# Patient Record
Sex: Male | Born: 1975 | Race: White | Hispanic: No | Marital: Single
Health system: Southern US, Community
[De-identification: ages and names within clinical notes are randomized; demographics above are authoritative.]

## PROBLEM LIST (undated history)

## (undated) DIAGNOSIS — S069XAA Unspecified intracranial injury with loss of consciousness status unknown, initial encounter: Secondary | ICD-10-CM

## (undated) DIAGNOSIS — N390 Urinary tract infection, site not specified: Secondary | ICD-10-CM

## (undated) DIAGNOSIS — S069X9A Unspecified intracranial injury with loss of consciousness of unspecified duration, initial encounter: Secondary | ICD-10-CM

---

## 1997-10-30 ENCOUNTER — Other Ambulatory Visit: Admission: RE | Admit: 1997-10-30 | Discharge: 1997-10-30 | Payer: Self-pay | Admitting: Family Medicine

## 1998-02-05 ENCOUNTER — Encounter: Admission: RE | Admit: 1998-02-05 | Discharge: 1998-05-06 | Payer: Self-pay

## 1999-03-02 ENCOUNTER — Ambulatory Visit (HOSPITAL_COMMUNITY): Admission: RE | Admit: 1999-03-02 | Discharge: 1999-03-02 | Payer: Self-pay | Admitting: *Deleted

## 2002-01-05 ENCOUNTER — Encounter: Payer: Self-pay | Admitting: Emergency Medicine

## 2002-01-05 ENCOUNTER — Emergency Department (HOSPITAL_COMMUNITY): Admission: EM | Admit: 2002-01-05 | Discharge: 2002-01-05 | Payer: Self-pay | Admitting: Emergency Medicine

## 2002-04-07 ENCOUNTER — Emergency Department (HOSPITAL_COMMUNITY): Admission: EM | Admit: 2002-04-07 | Discharge: 2002-04-07 | Payer: Self-pay | Admitting: Emergency Medicine

## 2002-04-07 ENCOUNTER — Encounter: Payer: Self-pay | Admitting: Emergency Medicine

## 2003-07-19 ENCOUNTER — Emergency Department (HOSPITAL_COMMUNITY): Admission: EM | Admit: 2003-07-19 | Discharge: 2003-07-19 | Payer: Self-pay | Admitting: Emergency Medicine

## 2003-07-30 ENCOUNTER — Emergency Department (HOSPITAL_COMMUNITY): Admission: EM | Admit: 2003-07-30 | Discharge: 2003-07-30 | Payer: Self-pay | Admitting: Emergency Medicine

## 2003-09-28 ENCOUNTER — Emergency Department (HOSPITAL_COMMUNITY): Admission: EM | Admit: 2003-09-28 | Discharge: 2003-09-28 | Payer: Self-pay | Admitting: Emergency Medicine

## 2005-04-06 ENCOUNTER — Encounter: Admission: RE | Admit: 2005-04-06 | Discharge: 2005-05-04 | Payer: Self-pay | Admitting: Internal Medicine

## 2005-05-05 ENCOUNTER — Encounter: Admission: RE | Admit: 2005-05-05 | Discharge: 2005-06-29 | Payer: Self-pay | Admitting: Internal Medicine

## 2007-05-08 ENCOUNTER — Encounter: Admission: RE | Admit: 2007-05-08 | Discharge: 2007-06-26 | Payer: Self-pay | Admitting: Internal Medicine

## 2007-06-18 ENCOUNTER — Emergency Department (HOSPITAL_COMMUNITY): Admission: EM | Admit: 2007-06-18 | Discharge: 2007-06-18 | Payer: Self-pay | Admitting: Emergency Medicine

## 2007-07-01 ENCOUNTER — Emergency Department (HOSPITAL_COMMUNITY): Admission: EM | Admit: 2007-07-01 | Discharge: 2007-07-01 | Payer: Self-pay | Admitting: Emergency Medicine

## 2008-06-02 ENCOUNTER — Encounter: Admission: RE | Admit: 2008-06-02 | Discharge: 2008-07-23 | Payer: Self-pay | Admitting: Internal Medicine

## 2015-10-15 ENCOUNTER — Ambulatory Visit (HOSPITAL_COMMUNITY)
Admission: EM | Admit: 2015-10-15 | Discharge: 2015-10-15 | Disposition: A | Discharge status: Home / Self Care | Payer: Medicaid Other | Attending: Family Medicine | Admitting: Family Medicine

## 2015-10-15 ENCOUNTER — Encounter (HOSPITAL_COMMUNITY): Payer: Self-pay | Admitting: *Deleted

## 2015-10-15 ENCOUNTER — Emergency Department (HOSPITAL_COMMUNITY)
Admission: EM | Admit: 2015-10-15 | Discharge: 2015-10-17 | Disposition: A | Discharge status: Home / Self Care | Payer: Medicare Other | Attending: Emergency Medicine | Admitting: Emergency Medicine

## 2015-10-15 ENCOUNTER — Encounter (HOSPITAL_COMMUNITY): Payer: Self-pay | Admitting: Emergency Medicine

## 2015-10-15 DIAGNOSIS — Z791 Long term (current) use of non-steroidal anti-inflammatories (NSAID): Secondary | ICD-10-CM | POA: Diagnosis not present

## 2015-10-15 DIAGNOSIS — N390 Urinary tract infection, site not specified: Secondary | ICD-10-CM | POA: Diagnosis not present

## 2015-10-15 DIAGNOSIS — Z79899 Other long term (current) drug therapy: Secondary | ICD-10-CM | POA: Diagnosis not present

## 2015-10-15 DIAGNOSIS — S069XAA Unspecified intracranial injury with loss of consciousness status unknown, initial encounter: Secondary | ICD-10-CM | POA: Diagnosis present

## 2015-10-15 DIAGNOSIS — R45851 Suicidal ideations: Secondary | ICD-10-CM | POA: Insufficient documentation

## 2015-10-15 DIAGNOSIS — F4321 Adjustment disorder with depressed mood: Secondary | ICD-10-CM | POA: Diagnosis present

## 2015-10-15 DIAGNOSIS — F17200 Nicotine dependence, unspecified, uncomplicated: Secondary | ICD-10-CM | POA: Insufficient documentation

## 2015-10-15 DIAGNOSIS — S069X9A Unspecified intracranial injury with loss of consciousness of unspecified duration, initial encounter: Secondary | ICD-10-CM | POA: Diagnosis present

## 2015-10-15 HISTORY — DX: Urinary tract infection, site not specified: N39.0

## 2015-10-15 HISTORY — DX: Unspecified intracranial injury with loss of consciousness status unknown, initial encounter: S06.9XAA

## 2015-10-15 HISTORY — DX: Unspecified intracranial injury with loss of consciousness of unspecified duration, initial encounter: S06.9X9A

## 2015-10-15 LAB — POCT URINALYSIS DIP (DEVICE)
GLUCOSE, UA: NEGATIVE mg/dL
Ketones, ur: NEGATIVE mg/dL
NITRITE: POSITIVE — AB
PROTEIN: 100 mg/dL — AB
Specific Gravity, Urine: >=1.03 (ref 1.005–1.030)
UROBILINOGEN UA: 1 mg/dL (ref 0.0–1.0)
pH: 5.5 (ref 5.0–8.0)

## 2015-10-15 LAB — CBC
HCT: 45.6 % (ref 39.0–52.0)
Hemoglobin: 14.9 g/dL (ref 13.0–17.0)
MCH: 29.7 pg (ref 26.0–34.0)
MCHC: 32.7 g/dL (ref 30.0–36.0)
MCV: 90.8 fL (ref 78.0–100.0)
PLATELETS: 181 10*3/uL (ref 150–400)
RBC: 5.02 MIL/uL (ref 4.22–5.81)
RDW: 14 % (ref 11.5–15.5)
WBC: 6 10*3/uL (ref 4.0–10.5)

## 2015-10-15 LAB — URINE MICROSCOPIC-ADD ON

## 2015-10-15 LAB — URINALYSIS, ROUTINE W REFLEX MICROSCOPIC
GLUCOSE, UA: NEGATIVE mg/dL
Ketones, ur: NEGATIVE mg/dL
Nitrite: POSITIVE — AB
PH: 5.5 (ref 5.0–8.0)
Protein, ur: NEGATIVE mg/dL
SPECIFIC GRAVITY, URINE: 1.031 — AB (ref 1.005–1.030)

## 2015-10-15 LAB — COMPREHENSIVE METABOLIC PANEL
ALBUMIN: 4.1 g/dL (ref 3.5–5.0)
ALT: 20 U/L (ref 17–63)
ANION GAP: 6 (ref 5–15)
AST: 22 U/L (ref 15–41)
Alkaline Phosphatase: 75 U/L (ref 38–126)
BILIRUBIN TOTAL: 0.6 mg/dL (ref 0.3–1.2)
BUN: 20 mg/dL (ref 6–20)
CHLORIDE: 107 mmol/L (ref 101–111)
CO2: 28 mmol/L (ref 22–32)
Calcium: 9.1 mg/dL (ref 8.9–10.3)
Creatinine, Ser: 1.04 mg/dL (ref 0.61–1.24)
GFR calc Af Amer: >60 mL/min (ref >60)
GLUCOSE: 126 mg/dL — AB (ref 65–99)
POTASSIUM: 4.1 mmol/L (ref 3.5–5.1)
Sodium: 141 mmol/L (ref 135–145)
TOTAL PROTEIN: 7 g/dL (ref 6.5–8.1)

## 2015-10-15 LAB — RAPID URINE DRUG SCREEN, HOSP PERFORMED
AMPHETAMINES: NOT DETECTED
BENZODIAZEPINES: NOT DETECTED
Barbiturates: NOT DETECTED
COCAINE: NOT DETECTED
Opiates: NOT DETECTED
Tetrahydrocannabinol: NOT DETECTED

## 2015-10-15 LAB — ETHANOL

## 2015-10-15 LAB — SALICYLATE LEVEL: Salicylate Lvl: <4 mg/dL (ref 2.8–30.0)

## 2015-10-15 LAB — ACETAMINOPHEN LEVEL

## 2015-10-15 MED ORDER — ACETAMINOPHEN 325 MG PO TABS: 650.0000 mg | ORAL_TABLET | ORAL | Status: DC | PRN

## 2015-10-15 MED ORDER — CEPHALEXIN 500 MG PO CAPS: 500.0000 mg | ORAL_CAPSULE | Freq: Four times a day (QID) | ORAL | Status: DC

## 2015-10-15 MED ORDER — BACLOFEN 20 MG PO TABS
20.0000 mg | ORAL_TABLET | Freq: Two times a day (BID) | ORAL | Status: DC
  Administered 2015-10-16 (×3): 20 mg via ORAL
  Filled 2015-10-15 (×5): qty 1

## 2015-10-15 MED ORDER — LORAZEPAM 1 MG PO TABS
1.0000 mg | ORAL_TABLET | Freq: Three times a day (TID) | ORAL | Status: DC | PRN
  Filled 2015-10-15: qty 1

## 2015-10-15 MED ORDER — CEPHALEXIN 500 MG PO CAPS
500.0000 mg | ORAL_CAPSULE | Freq: Four times a day (QID) | ORAL | Status: DC
  Administered 2015-10-16 (×5): 500 mg via ORAL
  Filled 2015-10-15 (×5): qty 1

## 2015-10-15 MED ORDER — LINACLOTIDE 145 MCG PO CAPS
145.0000 ug | ORAL_CAPSULE | Freq: Every day | ORAL | Status: DC
  Administered 2015-10-16 – 2015-10-17 (×2): 145 ug via ORAL
  Filled 2015-10-15 (×3): qty 1

## 2015-10-15 MED ORDER — LEVETIRACETAM 500 MG PO TABS
500.0000 mg | ORAL_TABLET | Freq: Two times a day (BID) | ORAL | Status: DC
  Administered 2015-10-16 (×3): 500 mg via ORAL
  Filled 2015-10-15 (×5): qty 1

## 2015-10-15 MED ORDER — IBUPROFEN 200 MG PO TABS
600.0000 mg | ORAL_TABLET | Freq: Three times a day (TID) | ORAL | Status: DC | PRN
  Administered 2015-10-16: 600 mg via ORAL
  Filled 2015-10-15: qty 3

## 2015-10-15 MED ORDER — TRIMETHOPRIM 100 MG PO TABS
100.0000 mg | ORAL_TABLET | Freq: Two times a day (BID) | ORAL | Status: DC
  Administered 2015-10-16 (×3): 100 mg via ORAL
  Filled 2015-10-15 (×5): qty 1

## 2015-10-15 NOTE — ED Notes (Signed)
Lab was called and confirm they had the urine sample and would run the urinalysis.

## 2015-10-15 NOTE — Discharge Instructions (Signed)
Antibiotic Medicine °Antibiotic medicines are used to treat infections caused by bacteria. They work by injuring or killing the bacteria that is making you sick. °HOW IS AN ANTIBIOTIC CHOSEN? °An antibiotic is chosen based on many factors. To help your health care provider choose one for you, tell your health care provider if: °· You have any allergies. °· You are pregnant or plan to get pregnant. °· You are breastfeeding. °· You are taking any medicines. These include over-the-counter medicines, prescription medicines, and herbal remedies. °· You have a medical condition or problem you have not already discussed. °Your health care provider will also consider: °· How often the medicine has to be taken. °· Common side effects of the medicine. °· The cost of the medicine. °· The taste of the medicine. °If you have questions about why an antibiotic was chosen, make sure to ask. °FOR HOW LONG SHOULD I TAKE MY ANTIBIOTIC? °Continue to take your antibiotic for as long as told by your health care provider. Do not stop taking it when you feel better. If you stop taking it too soon: °· You may start to feel sick again. °· Your infection may become harder to treat. °· Complications may develop. °WHAT IF I MISS A DOSE? °Try not to miss any doses of medicine. If you miss a dose, take it as soon as possible. However, if it is almost time for the next dose: °· If you are taking 2 doses per day, take the missed dose and the next dose 5 to 6 hours apart. °· If you are taking 3 or more doses per day, take the missed dose and the next dose 2 to 4 hours apart, then go back to the normal schedule. °If you cannot make up a missed dose, take the next scheduled dose on time. Then take the missed dose after you have taken all the doses as recommended by your health care provider, as if you had one more dose left. °DO ANTIBIOTICS AFFECT BIRTH CONTROL? °Birth control pills may not work while you are on antibiotics. If you are taking birth  control pills, continue taking them as usual and use a second form of birth control, such as a condom, to avoid unwanted pregnancy. Continue using the second form of birth control until you are finished with your current 1 month cycle of birth control pills. °OTHER INFORMATION °· If there is any medicine left over, throw it away. °· Never take someone else's antibiotics. °· Never take leftover antibiotics. °SEEK MEDICAL CARE IF: °· You get worse. °· You do not feel better within a few days of starting the antibiotic medicine. °· You vomit. °· White patches appear in your mouth. °· You have new joint pain that begins after starting the antibiotic. °· You have new muscle aches that begin after starting the antibiotic. °· You had a fever before starting the antibiotic and it returns. °· You have any symptoms of an allergic reaction, such as an itchy rash. If this happens, stop taking the antibiotic. °SEEK IMMEDIATE MEDICAL CARE IF: °· Your urine turns dark or becomes blood-colored. °· Your skin turns yellow. °· You bruise or bleed easily. °· You have severe diarrhea and abdominal cramps. °· You have a severe headache. °· You have signs of a severe allergic reaction, such as: °· Trouble breathing. °· Wheezing. °· Swelling of the lips, tongue, or face. °· Fainting. °· Blisters on the skin or in the mouth. °If you have signs of a severe allergic   reaction, stop taking the antibiotic right away. °  °This information is not intended to replace advice given to you by your health care provider. Make sure you discuss any questions you have with your health care provider. °  °Document Released: 12/22/2003 Document Revised: 12/30/2014 Document Reviewed: 08/26/2014 °Elsevier Interactive Patient Education ©2016 Elsevier Inc. ° °Urinary Tract Infection °Urinary tract infections (UTIs) can develop anywhere along your urinary tract. Your urinary tract is your body's drainage system for removing wastes and extra water. Your urinary  tract includes two kidneys, two ureters, a bladder, and a urethra. Your kidneys are a pair of bean-shaped organs. Each kidney is about the size of your fist. They are located below your ribs, one on each side of your spine. °CAUSES °Infections are caused by microbes, which are microscopic organisms, including fungi, viruses, and bacteria. These organisms are so small that they can only be seen through a microscope. Bacteria are the microbes that most commonly cause UTIs. °SYMPTOMS  °Symptoms of UTIs may vary by age and gender of the patient and by the location of the infection. Symptoms in young women typically include a frequent and intense urge to urinate and a painful, burning feeling in the bladder or urethra during urination. Older women and men are more likely to be tired, shaky, and weak and have muscle aches and abdominal pain. A fever may mean the infection is in your kidneys. Other symptoms of a kidney infection include pain in your back or sides below the ribs, nausea, and vomiting. °DIAGNOSIS °To diagnose a UTI, your caregiver will ask you about your symptoms. Your caregiver will also ask you to provide a urine sample. The urine sample will be tested for bacteria and white blood cells. White blood cells are made by your body to help fight infection. °TREATMENT  °Typically, UTIs can be treated with medication. Because most UTIs are caused by a bacterial infection, they usually can be treated with the use of antibiotics. The choice of antibiotic and length of treatment depend on your symptoms and the type of bacteria causing your infection. °HOME CARE INSTRUCTIONS °· If you were prescribed antibiotics, take them exactly as your caregiver instructs you. Finish the medication even if you feel better after you have only taken some of the medication. °· Drink enough water and fluids to keep your urine clear or pale yellow. °· Avoid caffeine, tea, and carbonated beverages. They tend to irritate your  bladder. °· Empty your bladder often. Avoid holding urine for long periods of time. °· Empty your bladder before and after sexual intercourse. °· After a bowel movement, women should cleanse from front to back. Use each tissue only once. °SEEK MEDICAL CARE IF:  °· You have back pain. °· You develop a fever. °· Your symptoms do not begin to resolve within 3 days. °SEEK IMMEDIATE MEDICAL CARE IF:  °· You have severe back pain or lower abdominal pain. °· You develop chills. °· You have nausea or vomiting. °· You have continued burning or discomfort with urination. °MAKE SURE YOU:  °· Understand these instructions. °· Will watch your condition. °· Will get help right away if you are not doing well or get worse. °  °This information is not intended to replace advice given to you by your health care provider. Make sure you discuss any questions you have with your health care provider. °  °Document Released: 01/18/2005 Document Revised: 12/30/2014 Document Reviewed: 05/19/2011 °Elsevier Interactive Patient Education ©2016 Elsevier Inc. ° °Urine Culture   and Sensitivity Testing °WHY AM I HAVING THIS TEST?  °A urine culture is a test to see if germs grow from your urine sample. Normally, urine is free of germs (sterile). Germs in urine are usually bacteria. Sometimes they can be yeasts. These germs can cause a urinary tract infection (UTI). °You may have this test if you have symptoms of a UTI. These may include: °· Frequent urination. °· Burning pain when passing urine. °If you are pregnant, your health care provider may order this test to screen you for a UTI. °When you pass urine, the urine flows through the tube that empties your bladder (urethra). In men, urine comes out through an opening at the tip of the penis. In women, it comes out of the body from just above the vaginal opening. These areas may have bacteria near them that normally live on the skin (normal flora). °WHAT KIND OF SAMPLE IS TAKEN? °A urine sample for  a culture test must be collected in a way that keeps normal flora from getting into the sample. The method used most often is called a clean-catch sample. In a few cases, urine may need to be collected directly from the bladder using a thin, flexible tube (catheter). The health care provider puts the catheter through the person's urethra and into the bladder. °Your urine sample will be placed onto plates containing a substance that encourages bacteria to grow (agar plates). These plates are kept at body temperature for 24-48 hours to see if bacteria or other germs grow. Then, a lab technician examines them under a microscope to check for germs. °Any germs that grow from the culture will be tested against a variety of medicines to find the one that works best (sensitivity testing). For a UTI caused by bacteria, several types of antibiotic medicines may be tested. °HOW DO I PREPARE FOR THE TEST? °· Do not urinate for about an hour before collecting the sample. °· Drink a glass of water about 20 minutes before collecting the sample. °· Tell your health care provider if you have been taking antibiotics. This may affect the results of your test. °Your health care provider may give you sterile wipes to clean your vagina or penis to prepare for collecting a clean-catch sample. To collect the sample, you will need to do the following: °For Women and Girls °· Sit on the toilet and spread the lips of your vagina. °· Use one wipe to clean your vaginal area from front to back. °· Use a second wipe to clean the opening of your urethra. °· Pass a small amount of urine directly into the toilet while still spreading your vagina. °· Then, hold the sterile cup underneath you and urinate into it. °· Fill the cup about halfway. Cap it and return it for testing. °For Men and Boys °· Use the sterile wipe to clean the tip of your penis. °· Pass a small amount of urine directly into the toilet first. °· Then, urinate into the sterile  cup. °· Fill the cup about halfway. Cap it and return it for testing. °WHAT DO THE RESULTS MEAN? °The result of a urine culture and sensitivity test will be positive or negative.  °· If enough bacteria grow from your urine sample, your test result is considered positive. °· If many different bacteria grow from your urine sample, your test may be reported as contaminated. °· If no bacteria grow from your sample after 24-48 hours, your test result is considered negative. °· Results   of sensitivity testing let your health care provider know which medicines to use to treat your infection. °If the results of your urine culture are negative, this means: °· It is less likely that you have a UTI. °· Your test may be repeated if you still have symptoms. °If the results of your urine culture are positive, this means: °· It is more likely that you have a UTI. °· You may need to start treatment based on your sensitivity results. °Talk to your health care provider to discuss your results, treatment options, and if necessary, the need for more tests. °It is your responsibility to obtain your test results. Ask the lab or department performing the test when and how you will get your results. Talk with your health care provider if you have any questions about your results. °  °This information is not intended to replace advice given to you by your health care provider. Make sure you discuss any questions you have with your health care provider. °  °Document Released: 05/05/2004 Document Revised: 05/01/2014 Document Reviewed: 08/07/2013 °Elsevier Interactive Patient Education ©2016 Elsevier Inc. ° °

## 2015-10-15 NOTE — ED Provider Notes (Signed)
CSN: [id number]     Arrival date & time 10/15/15  1323 History   First MD Initiated Contact with Patient 10/15/15 1334     Chief Complaint  Patient presents with  . 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 Today his complaint is dysuria for up to one month. The caretaker that is present states he just started this today. He is unaware of any previous medical history in regards to symptoms, fever or other. There is no documentation with him regarding this condition. The current chart states that he is on trimethoprim twice a day. He is alert but his speech is dysarthric and very slow, almost unintelligible.  Patient is a 40 y.o. male presenting with urinary tract infection.  Urinary Tract Infection    Past Medical History  Diagnosis Date  . UTI (urinary tract infection)    History reviewed. No pertinent past surgical history. Family History  Problem Relation Age of Onset  . Family history unknown: Yes   Social History  Substance Use Topics  . Smoking status: Current Every Day Smoker  . Smokeless tobacco: None  . 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  HENT: Negative.   Respiratory: Negative.   Gastrointestinal: Negative.   Genitourinary: Positive for dysuria and frequency.  All other systems reviewed and are negative.   Allergies  Review of patient's allergies indicates no known allergies.  Home Medications   Prior to Admission medications   Medication Sig Start Date End Date Taking? Authorizing Provider  baclofen (LIORESAL) 10 MG tablet Take 10 mg by mouth 3 (three) times daily.   Yes Historical Provider, MD  levETIRAcetam (KEPPRA) 500 MG tablet Take 500 mg by  mouth 2 (two) times daily.   Yes Historical Provider, MD  linaclotide (LINZESS) 145 MCG CAPS capsule Take 145 mcg by mouth daily before breakfast.   Yes Historical Provider, MD  trimethoprim (TRIMPEX) 100 MG tablet Take 100 mg by mouth 2 (two) times daily.   Yes Historical Provider, MD  cephALEXin (KEFLEX) 500 MG capsule Take 1 capsule (500 mg total) by mouth 4 (four) times daily. 10/15/15   Hayden Rasmussenavid Treg Diemer, NP   Meds Ordered and Administered this Visit  Medications - No data to display  BP 125/87 mmHg  Pulse 90  Temp(Src) 99.2 F (37.3 C) (Oral)  Resp 18  SpO2 93% No data found.   Physical Exam  Constitutional: He appears well-nourished. No distress.  Eyes: EOM are normal.  Cardiovascular: Normal rate.   Pulmonary/Chest: Effort normal.  Musculoskeletal:  Torpid movements of the upper extremities  Neurological: He is alert. He exhibits normal muscle tone.  Skin: Skin is warm and dry.  Nursing note and vitals reviewed.   ED Course  Procedures (including critical care time)  Labs Review Labs Reviewed  POCT URINALYSIS DIP (DEVICE) - Abnormal; Notable for the following:    Bilirubin Urine SMALL (*)    Hgb urine dipstick TRACE (*)    Protein, ur 100 (*)    Nitrite POSITIVE (*)    Leukocytes, UA TRACE (*)    All other components within normal limits  URINE CULTURE    Imaging Review No results found.   Visual Acuity Review  Right Eye Distance:   Left Eye Distance:   Bilateral  Distance:    Right Eye Near:   Left Eye Near:    Bilateral Near:         MDM   1. UTI (lower urinary tract infection)    Meds ordered this encounter  Medications  . levETIRAcetam (KEPPRA) 500 MG tablet    Sig: Take 500 mg by mouth 2 (two) times daily.  Marland Kitchen. linaclotide (LINZESS) 145 MCG CAPS capsule    Sig: Take 145 mcg by mouth daily before breakfast.  . trimethoprim (TRIMPEX) 100 MG tablet    Sig: Take 100 mg by mouth 2 (two) times daily.  . baclofen (LIORESAL) 10 MG tablet    Sig:  Take 10 mg by mouth 3 (three) times daily.  . cephALEXin (KEFLEX) 500 MG capsule    Sig: Take 1 capsule (500 mg total) by mouth 4 (four) times daily.    Dispense:  40 capsule    Refill:  0    Order Specific Question:  Supervising Provider    Answer:  Bradd CanaryKINDL, JAMES D [[id number]]   Cephalexin was prescribed during this encounter. May continue taking the trimethoprim. Drink plenty of fluids and stay well-hydrated and follow-up with your [facility] doctor in a week or sooner if not getting better or having problems. Call for appointment Urine culture pending.    Hayden Rasmussenavid Jorell Agne, NP 10/15/15 [phone]
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
## 2015-10-15 NOTE — ED Provider Notes (Signed)
CSN: [id number]     Arrival date & time 10/15/15  2105 History   First MD Initiated Contact with Patient 10/15/15 2141     Chief Complaint  Patient presents with  . Medical Clearance      The history is provided by the EMS personnel, the patient and medical records.  Patient is under IVC and was brought in for leaving his house and reportedly being suicidal. Patient is very difficult to understand somewhat difficult to get history from. He states he was trying to leave. States he would not care for by car. Has had some dysuria. 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 Patient has reportedly in the care of the state. Reviewing records she was seen at urgent care earlier today and diagnosed UTI and given Keflex for it.  Past Medical History  Diagnosis Date  . UTI (urinary tract infection)   . 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 No pertinent past surgical history. Family History  Problem Relation Age of Onset  . Family history unknown: Yes   Social History  Substance Use Topics  . Smoking status: Current Every Day Smoker  . Smokeless tobacco: None  . 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  Psychiatric/Behavioral: Positive for suicidal ideas.      Allergies  Review of patient's allergies indicates no known allergies.  Home Medications   Prior to Admission medications   Medication Sig Start Date End Date Taking? Authorizing Provider  baclofen (LIORESAL) 10 MG tablet Take 20 mg by mouth 2 (two) times daily.    Yes Historical Provider, MD  levETIRAcetam (KEPPRA) 500 MG tablet Take 500 mg by mouth 2 (two) times daily.   Yes Historical Provider, MD  linaclotide (LINZESS) 145 MCG CAPS capsule Take 145 mcg by mouth daily before breakfast.   Yes Historical Provider, MD  trimethoprim (TRIMPEX) 100 MG tablet Take 100 mg by mouth 2 (two) times daily.   Yes Historical  Provider, MD  cephALEXin (KEFLEX) 500 MG capsule Take 1 capsule (500 mg total) by mouth 4 (four) times daily. 10/15/15   Hayden Rasmussenavid Mabe, NP   BP 126/82 mmHg  Pulse 102  Temp(Src) 98.4 F (36.9 C) (Oral)  Resp 18  SpO2 93% Physical Exam  Constitutional: He appears well-nourished.  Eyes: EOM are normal.  Neck: Neck supple.  Pulmonary/Chest: Effort normal.  Abdominal: Soft.  Neurological: He is alert.  Very difficult to understand but is apparently awake and appropriate. Confined to wheelchair.  Skin: Skin is warm.    ED Course  Procedures (including critical care time) Labs Review Labs Reviewed  COMPREHENSIVE METABOLIC PANEL - Abnormal; Notable for the following:    Glucose, Bld 126 (*)    All other components within normal limits  ACETAMINOPHEN LEVEL - Abnormal; Notable for the following:    Acetaminophen (Tylenol), Serum <10 (*)    All other components within normal limits  URINALYSIS, ROUTINE W REFLEX MICROSCOPIC (NOT AT [facility]) - Abnormal; Notable for the following:    Color, Urine AMBER (*)    APPearance CLOUDY (*)    Specific Gravity, Urine 1.031 (*)    Hgb urine dipstick TRACE (*)    Bilirubin Urine SMALL (*)    Nitrite POSITIVE (*)    Leukocytes, UA TRACE (*)    All other components within normal limits  URINE MICROSCOPIC-ADD ON - Abnormal; Notable for the following:  Squamous Epithelial / LPF 6-30 (*)    Bacteria, UA FEW (*)    All other components within normal limits  URINE CULTURE  ETHANOL  SALICYLATE LEVEL  CBC  URINE RAPID DRUG SCREEN, HOSP PERFORMED    Imaging Review No results found. I have personally reviewed and evaluated these images and lab results as part of my medical decision-making.   EKG Interpretation None      MDM   Final diagnoses:  Suicidal ideations  Acute lower UTI (urinary tract infection)    Patient with apparent urinary tract infection. Also suicidal. Appears medically cleared to be seen by TTS. Will start Keflex and urine  culture has been sent.    Benjiman CoreNathan Lisvet Rasheed, MD 10/15/15 [phone]

## 2015-10-15 NOTE — ED Notes (Signed)
Has a history of uti's per caregiver.  Complains of pain with urination

## 2015-10-16 ENCOUNTER — Encounter (HOSPITAL_COMMUNITY): Payer: Self-pay | Admitting: Registered Nurse

## 2015-10-16 DIAGNOSIS — S069XAA Unspecified intracranial injury with loss of consciousness status unknown, initial encounter: Secondary | ICD-10-CM | POA: Diagnosis present

## 2015-10-16 DIAGNOSIS — F4321 Adjustment disorder with depressed mood: Secondary | ICD-10-CM | POA: Diagnosis present

## 2015-10-16 DIAGNOSIS — S069X9A Unspecified intracranial injury with loss of consciousness of unspecified duration, initial encounter: Secondary | ICD-10-CM | POA: Diagnosis present

## 2015-10-16 DIAGNOSIS — R45851 Suicidal ideations: Secondary | ICD-10-CM | POA: Diagnosis not present

## 2015-10-16 LAB — URINE CULTURE

## 2015-10-16 NOTE — ED Notes (Signed)
He is cheerfully visiting with his sitter. He has no requests at this time.
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
## 2015-10-16 NOTE — Progress Notes (Signed)
CSW attempted to contact the patient's caregiver to alert him to the patient's discharge status and to see about transportation back to his residence.  Caregiver was not available and a voicemail message was left.  Seward SpeckLeo Alberto Pina [facility] FlintCSW,LCAS Behavioral Health Disposition CSW [phone]

## 2015-10-16 NOTE — ED Notes (Addendum)
Pt's caregiver employed by [employer], [phone] message left with oncall staff for Martina Sinnererrell Darden to return call to myself ASAP

## 2015-10-16 NOTE — ED Notes (Signed)
Pt. In burgundy scrubs. Pt.and belongings searched and wanded by security. Pt. 1 belongings bag.pt. Has 1 black pant, 1 pr. Black snickers, 1 pr. Black socks, 1 black/gray striped shirt.pt.belongings locked up at the nurses station on RES A and B side.

## 2015-10-16 NOTE — ED Notes (Signed)
Spoke with pts appointed caregiver Luis Morales, he states he spoke with a male today and advised him that he was told by [employer] that patient would be inpatient until at least Monday, Mr. Luis Morales states he is now out of town and will not return until tomorrow. Dr. Bishop Dublinarden also states Dr. Kyung Morales does not wish to return to his home, stating he will not force him to return if that is against his wishes. Mr. Luis Morales states he has been attempting to contact pts social worker Luis DusterMichelle Youkat(s/p), he states he has sent emails and called on multiple occasions with no response, he was unable to provide correct spelling of her name, phone # or email address. Mr. Luis Morales states if Mr. Luis Morales returns to his home and decides to leave, he can not stop him but will watch him and call [employer] again.

## 2015-10-16 NOTE — [facility] Suicide Risk Assessment (Signed)
Suicide Risk Assessment  Discharge Assessment   [facility]   Principal Problem: Adjustment disorder with depressed mood Discharge Diagnoses:  Patient Active Problem List   Diagnosis Date Noted  . Adjustment disorder with depressed mood [F43.21] 10/16/2015  . TBI (traumatic brain injury) (HCC) [S06.9X9A] 10/16/2015  . Suicidal ideations [R45.851]     Total Time spent with patient: 30 minutes  Musculoskeletal: Strength & Muscle Tone: decreased Gait & Station: Wheel chair for ambulation Patient leans: N/A   Psychiatric Specialty Exam:   Blood pressure 129/76, pulse 71, temperature 98.1 F (36.7 C), temperature source Oral, resp. rate 16, SpO2 98 %.There is no height or weight on file to calculate BMI.   General Appearance: Casual  Eye Contact: Good  Speech: Slurred  Volume: Decreased  Mood: Anxious  Affect: Congruent  Thought Process: Goal Directed  Orientation: Full (Time, Place, and Person)  Thought Content: Denies hallucinations, delusions, and paranoia  Suicidal Thoughts: No  Homicidal Thoughts: No  Memory: Immediate; Fair Recent; Fair Remote; Fair  Judgement: Fair  Insight: Lacking  Psychomotor Activity: Decreased  Concentration: Concentration: Fair and Attention Span: Fair  Recall: [employer]  Fund of Knowledge: Fair  Language: Fair  Akathisia: No  Handed: Right  AIMS (if indicated):    Assets: Desire for Improvement  ADL's: Intact  Cognition: WNL  Sleep:          Mental Status Per Nursing Assessment::   On Admission:     Demographic Factors:  Male and Caucasian  Loss Factors: Decline in physical health  Historical Factors: Prior suicide attempts  Risk Reduction Factors:   Positive therapeutic relationship  Continued Clinical Symptoms:  Medical Diagnoses and Treatments/Surgeries  Cognitive Features That Contribute To Risk:  Closed-mindedness    Suicide Risk:   Minimal: No identifiable suicidal ideation.  Patients presenting with no risk factors but with morbid ruminations; may be classified as minimal risk based on the severity of the depressive symptoms    Plan Of Care/Follow-up recommendations:  Activity:  As tolerated Diet:  As tolerated  Rankin, Shuvon, NP 10/16/2015, 4:08 PM   Patient seen and I agree with treatment plan  Diannia Rudereborah Ross M.D.

## 2015-10-16 NOTE — ED Notes (Signed)
Spoke with Erick BlinksSara, AC, referral call made to APS

## 2015-10-16 NOTE — ED Notes (Signed)
I have just attempted to phone his caregiver/guardian and left message on answering machine.

## 2015-10-16 NOTE — ED Notes (Signed)
Spoke with APS oncall-Wes, information given regarding situation, he will return call with further guidance.

## 2015-10-16 NOTE — ED Notes (Signed)
Pt awake, advised we were still attempting to contact caregiver, call bell within reach, bed adjusted per pt request.

## 2015-10-16 NOTE — Consult Note (Signed)
[facility] Face-to-Face Psychiatry Consult   Reason for Consult:  Wheeled wheel chair into road Referring Physician:  EDP Patient Identification: Luis Morales MRN:  [id number] Principal Diagnosis: Adjustment disorder with depressed mood Diagnosis:   Patient Active Problem List   Diagnosis Date Noted  . Adjustment disorder with depressed mood [F43.21] 10/16/2015  . TBI (traumatic brain injury) [facility]) [S06.9X9A] 10/16/2015    Total Time spent with patient: 30 minutes  Subjective:   Luis Morales is a 40 y.o. male patient [facility] under [facility] by care giver stating that patient had wheeled himself into the street trying to kill him self .  HPI:  Patient oriented, calm, and cooperative.  Speech is very slurred/garbled.  Patient was able to write down what we could not understand him saying.  Patient reports that his care giver "T/Travis" curses and yells at him and he doesn't like staying there.  State that Will took care of him last week and that Will works for [employer] and wants assistance finding some where else to live.  States that he does not want to live with T any longer.  Past Psychiatric History: Denies  Risk to Self: Suicidal Ideation: No Suicidal Intent: No Is patient at risk for suicide?: No Suicidal Plan?: No Access to Means: No What has been your use of drugs/alcohol within the last 12 months?: Denies How many times?: 0 Other Self Harm Risks: Denies Triggers for Past Attempts: None known Intentional Self Injurious Behavior: None Risk to Others: Homicidal Ideation: No Thoughts of Harm to Others: No Current Homicidal Intent: No Current Homicidal Plan: No Access to Homicidal Means: No History of harm to others?: No Assessment of Violence: None Noted Does patient have access to weapons?: No Criminal Charges Pending?: No Does patient have a court date: No Prior Inpatient Therapy: Prior Inpatient Therapy: Yes Prior Therapy Facilty/Provider(s): [facility] Prior Outpatient Therapy:  Prior Outpatient Therapy: No Prior Therapy Dates: UTA Prior Therapy Facilty/Provider(s): UTA Reason for Treatment: UTA Does patient have an ACCT team?: Unknown Does patient have Intensive In-House Services?  : Unknown Does patient have [facility] services? : Unknown Does patient have P4CC services?: Unknown  Past Medical History:  Past Medical History  Diagnosis Date  . UTI (urinary tract infection)   . Traumatic brain injury [facility])    History reviewed. No pertinent past surgical history. Family History:  Family History  Problem Relation Age of Onset  . Family history unknown: Yes   Family Psychiatric  History: Denies  Social History:  History  Alcohol Use No     History  Drug Use No    Social History   Social History  . Marital Status: Single    Spouse Name: N/A  . Number of Children: N/A  . Years of Education: N/A   Social History Main Topics  . Smoking status: Current Every Day Smoker  . Smokeless tobacco: None  . Alcohol Use: No  . Drug Use: No  . Sexual Activity: Not Asked   Other Topics Concern  . None   Social History Narrative   Additional Social History:    Allergies:  No Known Allergies  Labs:  Results for orders placed or performed during the hospital encounter of 10/15/15 (from the past 48 hour(s))  Comprehensive metabolic panel     Status: Abnormal   Collection Time: 10/15/15  9:48 PM  Result Value Ref Range   Sodium 141 135 - 145 mmol/L   Potassium 4.1 3.5 - 5.1 mmol/L   Chloride  107 101 - 111 mmol/L   CO2 28 22 - 32 mmol/L   Glucose, Bld 126 (H) 65 - 99 mg/dL   BUN 20 6 - 20 mg/dL   Creatinine, Ser 1.04 0.61 - 1.24 mg/dL   Calcium 9.1 8.9 - 10.3 mg/dL   Total Protein 7.0 6.5 - 8.1 g/dL   Albumin 4.1 3.5 - 5.0 g/dL   AST 22 15 - 41 U/L   ALT 20 17 - 63 U/L   Alkaline Phosphatase 75 38 - 126 U/L   Total Bilirubin 0.6 0.3 - 1.2 mg/dL   GFR calc non Af Amer >60 >60 mL/min   GFR calc Af Amer >60 >60 mL/min    Comment: (NOTE) The eGFR  has been calculated using the CKD EPI equation. This calculation has not been validated in all clinical situations. eGFR's persistently <60 mL/min signify possible Chronic Kidney Disease.    Anion gap 6 5 - 15  Ethanol     Status: None   Collection Time: 10/15/15  9:48 PM  Result Value Ref Range   Alcohol, Ethyl (B) <5 <5 mg/dL    Comment:        LOWEST DETECTABLE LIMIT FOR SERUM ALCOHOL IS 5 mg/dL FOR MEDICAL PURPOSES ONLY   Salicylate level     Status: None   Collection Time: 10/15/15  9:48 PM  Result Value Ref Range   Salicylate Lvl <2.8 2.8 - 30.0 mg/dL  Acetaminophen level     Status: Abnormal   Collection Time: 10/15/15  9:48 PM  Result Value Ref Range   Acetaminophen (Tylenol), Serum <10 (L) 10 - 30 ug/mL    Comment:        THERAPEUTIC CONCENTRATIONS VARY SIGNIFICANTLY. A RANGE OF 10-30 ug/mL MAY BE AN EFFECTIVE CONCENTRATION FOR MANY PATIENTS. HOWEVER, SOME ARE BEST TREATED AT CONCENTRATIONS OUTSIDE THIS RANGE. ACETAMINOPHEN CONCENTRATIONS >150 ug/mL AT 4 HOURS AFTER INGESTION AND >50 ug/mL AT 12 HOURS AFTER INGESTION ARE OFTEN ASSOCIATED WITH TOXIC REACTIONS.   cbc     Status: None   Collection Time: 10/15/15  9:48 PM  Result Value Ref Range   WBC 6.0 4.0 - 10.5 K/uL   RBC 5.02 4.22 - 5.81 MIL/uL   Hemoglobin 14.9 13.0 - 17.0 g/dL   HCT 45.6 39.0 - 52.0 %   MCV 90.8 78.0 - 100.0 fL   MCH 29.7 26.0 - 34.0 pg   MCHC 32.7 30.0 - 36.0 g/dL   RDW 14.0 11.5 - 15.5 %   Platelets 181 150 - 400 K/uL  Rapid urine drug screen (hospital performed)     Status: None   Collection Time: 10/15/15  9:54 PM  Result Value Ref Range   Opiates NONE DETECTED NONE DETECTED   Cocaine NONE DETECTED NONE DETECTED   Benzodiazepines NONE DETECTED NONE DETECTED   Amphetamines NONE DETECTED NONE DETECTED   Tetrahydrocannabinol NONE DETECTED NONE DETECTED   Barbiturates NONE DETECTED NONE DETECTED    Comment:        DRUG SCREEN FOR MEDICAL PURPOSES ONLY.  IF CONFIRMATION IS  NEEDED FOR ANY PURPOSE, NOTIFY LAB WITHIN 5 DAYS.        LOWEST DETECTABLE LIMITS FOR URINE DRUG SCREEN Drug Class       Cutoff (ng/mL) Amphetamine      1000 Barbiturate      200 Benzodiazepine   315 Tricyclics       176 Opiates          300 Cocaine  300 THC              50   Urinalysis, Routine w reflex microscopic     Status: Abnormal   Collection Time: 10/15/15  9:54 PM  Result Value Ref Range   Color, Urine AMBER (A) YELLOW    Comment: BIOCHEMICALS MAY BE AFFECTED BY COLOR   APPearance CLOUDY (A) CLEAR   Specific Gravity, Urine 1.031 (H) 1.005 - 1.030   pH 5.5 5.0 - 8.0   Glucose, UA NEGATIVE NEGATIVE mg/dL   Hgb urine dipstick TRACE (A) NEGATIVE   Bilirubin Urine SMALL (A) NEGATIVE   Ketones, ur NEGATIVE NEGATIVE mg/dL   Protein, ur NEGATIVE NEGATIVE mg/dL   Nitrite POSITIVE (A) NEGATIVE   Leukocytes, UA TRACE (A) NEGATIVE  Urine microscopic-add on     Status: Abnormal   Collection Time: 10/15/15  9:54 PM  Result Value Ref Range   Squamous Epithelial / LPF 6-30 (A) NONE SEEN   WBC, UA 6-30 0 - 5 WBC/hpf   RBC / HPF 0-5 0 - 5 RBC/hpf   Bacteria, UA FEW (A) NONE SEEN   Urine-Other MUCOUS PRESENT     Comment: AMORPHOUS URATES/PHOSPHATES    Current Facility-Administered Medications  Medication Dose Route Frequency Provider Last Rate Last Dose  . acetaminophen (TYLENOL) tablet 650 mg  650 mg Oral Q4H PRN Davonna Belling, MD      . baclofen (LIORESAL) tablet 20 mg  20 mg Oral BID Davonna Belling, MD   20 mg at 10/16/15 0929  . cephALEXin (KEFLEX) capsule 500 mg  500 mg Oral QID Davonna Belling, MD   500 mg at 10/16/15 1410  . ibuprofen (ADVIL,MOTRIN) tablet 600 mg  600 mg Oral Q8H PRN Davonna Belling, MD      . levETIRAcetam (KEPPRA) tablet 500 mg  500 mg Oral BID Davonna Belling, MD   500 mg at 10/16/15 0929  . linaclotide (LINZESS) capsule 145 mcg  145 mcg Oral QAC breakfast Davonna Belling, MD   145 mcg at 10/16/15 0732  . LORazepam (ATIVAN) tablet 1  mg  1 mg Oral Q8H PRN Davonna Belling, MD      . trimethoprim (TRIMPEX) tablet 100 mg  100 mg Oral BID Davonna Belling, MD   100 mg at 10/16/15 5784   Current Outpatient Prescriptions  Medication Sig Dispense Refill  . baclofen (LIORESAL) 10 MG tablet Take 20 mg by mouth 2 (two) times daily.     Marland Kitchen levETIRAcetam (KEPPRA) 500 MG tablet Take 500 mg by mouth 2 (two) times daily.    Marland Kitchen linaclotide (LINZESS) 145 MCG CAPS capsule Take 145 mcg by mouth daily before breakfast.    . trimethoprim (TRIMPEX) 100 MG tablet Take 100 mg by mouth 2 (two) times daily.    . cephALEXin (KEFLEX) 500 MG capsule Take 1 capsule (500 mg total) by mouth 4 (four) times daily. 40 capsule 0    Musculoskeletal: Strength & Muscle Tone: decreased Gait & Station: Wheel chair for ambulation Patient leans: N/A  Psychiatric Specialty Exam: Physical Exam  Constitutional: He is oriented to person, place, and time.  Respiratory: Effort normal.  Neurological: He is alert and oriented to person, place, and time.    Review of Systems  Musculoskeletal:       Wheel chair  Neurological:       TBI  Psychiatric/Behavioral: Negative for depression, suicidal ideas, hallucinations and substance abuse. The patient is not nervous/anxious and does not have insomnia.   All other systems reviewed and  are negative.   Blood pressure 129/76, pulse 71, temperature 98.1 F (36.7 C), temperature source Oral, resp. rate 16, SpO2 98 %.There is no height or weight on file to calculate BMI.  General Appearance: Casual  Eye Contact:  Good  Speech:  Slurred  Volume:  Decreased  Mood:  Anxious  Affect:  Congruent  Thought Process:  Goal Directed  Orientation:  Full (Time, Place, and Person)  Thought Content:  Denies hallucinations, delusions, and paranoia  Suicidal Thoughts:  No  Homicidal Thoughts:  No  Memory:  Immediate;   Fair Recent;   Fair Remote;   Fair  Judgement:  Fair  Insight:  Lacking  Psychomotor Activity:  Decreased   Concentration:  Concentration: Fair and Attention Span: Fair  Recall:  [employer] of Knowledge:  Fair  Language:  Fair  Akathisia:  No  Handed:  Right  AIMS (if indicated):     Assets:  Desire for Improvement  ADL's:  Intact  Cognition:  WNL  Sleep:        Treatment Plan Summary: Plan Discharge home with Care giver  Disposition: No evidence of imminent risk to self or others at present.   Patient does not meet criteria for psychiatric inpatient admission.     Earleen Newport, NP 10/16/2015 3:54 PM Patient seen and I agree with treatment and plan Levonne Spiller M.D.

## 2015-10-16 NOTE — ED Notes (Signed)
TTS at bedside. 

## 2015-10-16 NOTE — BH Assessment (Addendum)
Assessment Note  Luis Morales is an 40 y.o. male presenting to [facility] under [facility].   IVC states:  [name] has no listed mental diagnosis but [place]. [name] is in a wheelchair and rolled himself into  The street in front of traffic. [name] stated he doesn't care if he gets hit by a car he wants to die. [name] stated he's tired of living the way he does in the chair. [name] has stated he wants to go to the hospital or die. According to caregiver respondent is not taking his medications as prescribed. He told his caregiver to sleep with one eye open. [name] is cursing at everyone and making threats to others in the home. [name] is a danger to self or others.   Patient is difficult to understand in his speech. Patient denies SI and states "I want to live." Patient denies attempts and self  Injurious behaviors. Patient states that he does not like his caregiver and no longer wants to live with him. Patient reports that he wants to live with "Will." Patient denies HI and history of aggression. Patient denies AVH and does not appear to be responding to internal stimuli. Patient denies history of trauma/abuse and denies drug use. Patient UDS and BAL is clear at this time. Patient reports that he is an juncle to a niece and a nephew and that makes him happy and states "I want to live."   Consulted with Alberteen SamFran Hobson, NP who recommends observation overnight and evaluation in the morning by psychiatry for final disposition.   Diagnosis: Adjustment Disorder, with disturbance of conduct  Past Medical History:  Past Medical History  Diagnosis Date  . UTI (urinary tract infection)   . Traumatic brain injury [facility](HCC)     History reviewed. No pertinent past surgical history.  Family History:  Family History  Problem Relation Age of Onset  . Family history unknown: Yes    Social History:  reports that he has been smoking.  He does not have any  smokeless tobacco history on file. He reports that he does not drink alcohol or use illicit drugs.  Additional Social History:  Alcohol / Drug Use Pain Medications: See PTA Prescriptions: See PTA Over the Counter: See PTA History of alcohol / drug use?: No history of alcohol / drug abuse  CIWA: CIWA-Ar BP: 134/77 mmHg Pulse Rate: 98 COWS:    Allergies: No Known Allergies  Home Medications:  (Not in a hospital admission)  OB/GYN Status:  No LMP for male patient.  General Assessment Data [facility]: [facility] Assessment: In system Is this a Tele or Face-to-Face Assessment?: Face-to-Face Is this an Initial Assessment or a Re-assessment for this encounter?: Initial Assessment Marital status: Single Is patient pregnant?: No Pregnancy Status: No Living Arrangements: Non-relatives/Friends Can pt return to current living arrangement?: Yes Admission Status: Involuntary Is patient capable of signing voluntary admission?: No Referral Source: Self/Family/Friend     Crisis Care Plan Living Arrangements: Non-relatives/Friends  Education Status Is patient currently in school?: No  Risk to self with the past 6 months Suicidal Ideation: No Has patient been a risk to self within the past 6 months prior to admission? : No Suicidal Intent: No Has patient had any suicidal intent within the past 6 months prior to admission? : No Is patient at risk for suicide?: No Suicidal Plan?: No Has patient had any suicidal plan within the past 6 months prior to admission? : No Access to  Means: No What has been your use of drugs/alcohol within the last 12 months?: Denies Previous Attempts/Gestures: No How many times?: 0 Other Self Harm Risks: Denies Triggers for Past Attempts: None known Intentional Self Injurious Behavior: None Family Suicide History: No Persecutory voices/beliefs?: No Depression: Yes Depression Symptoms:  (unable to identify) Substance abuse history and/or  treatment for substance abuse?: No Suicide prevention information given to non-admitted patients: Not applicable  Risk to Others within the past 6 months Homicidal Ideation: No Does patient have any lifetime risk of violence toward others beyond the six months prior to admission? : No Thoughts of Harm to Others: No Current Homicidal Intent: No Current Homicidal Plan: No Access to Homicidal Means: No History of harm to others?: No Assessment of Violence: None Noted Does patient have access to weapons?: No Criminal Charges Pending?: No Does patient have a court date: No Is patient on probation?: No  Psychosis Hallucinations: None noted Delusions: None noted  Mental Status Report Appearance/Hygiene: In scrubs Eye Contact: Good Motor Activity: Unable to assess Speech: Slurred Level of Consciousness: Alert Mood: Pleasant Affect: Appropriate to circumstance Anxiety Level: None Thought Processes: Coherent, Relevant Judgement: Unimpaired Orientation: Person, Place, Time, Situation, Appropriate for developmental age Obsessive Compulsive Thoughts/Behaviors: None  Cognitive Functioning Concentration: Unable to Assess Memory: Recent Intact, Remote Intact IQ: Average Insight: Fair Impulse Control: Fair Appetite: Good Sleep: Unable to Assess Vegetative Symptoms: Unable to Assess  ADLScreening [facility] Assessment Services) Patient's cognitive ability adequate to safely complete daily activities?: No Patient able to express need for assistance with ADLs?: Yes Independently performs ADLs?: No  Prior Inpatient Therapy Prior Inpatient Therapy: Yes Prior Therapy Facilty/Provider(s): [facility]  Prior Outpatient Therapy Prior Outpatient Therapy: No Prior Therapy Dates: UTA Prior Therapy Facilty/Provider(s): UTA Reason for Treatment: UTA Does patient have an ACCT team?: Unknown Does patient have Intensive In-House Services?  : Unknown Does patient have [facility] services? : Unknown Does  patient have P4CC services?: Unknown  ADL Screening (condition at time of admission) Patient's cognitive ability adequate to safely complete daily activities?: No Is the patient deaf or have difficulty hearing?: No Does the patient have difficulty seeing, even when wearing glasses/contacts?: No Does the patient have difficulty concentrating, remembering, or making decisions?: No Patient able to express need for assistance with ADLs?: Yes Does the patient have difficulty dressing or bathing?: Yes Independently performs ADLs?: No Weakness of Legs: Both Weakness of Arms/Hands: Both  Home Assistive Devices/Equipment Home Assistive Devices/Equipment: Wheelchair, Shower chair with back    Abuse/Neglect Assessment (Assessment to be complete while patient is alone) Physical Abuse: Denies Verbal Abuse: Denies Sexual Abuse: Denies Exploitation of patient/patient's resources: Denies Self-Neglect: Denies Values / Beliefs Cultural Requests During Hospitalization: None Spiritual Requests During Hospitalization: None Consults Spiritual Care Consult Needed: No Social Work Consult Needed: No Merchant navy officerAdvance Directives (For Healthcare) Does patient have an advance directive?: No Would patient like information on creating an advanced directive?: No - patient declined information    Additional Information 1:1 In Past 12 Months?: No CIRT Risk: No Elopement Risk: No Does patient have medical clearance?: Yes     Disposition:  Disposition Initial Assessment Completed for this Encounter: Yes Disposition of Patient: Other dispositions (observe overnight per Alberteen SamFran Hobson, NP) Other disposition(s): Other (Comment)  On Site Evaluation by:   Reviewed with Physician:    Florice Hindle 10/16/2015 2:40 AM

## 2015-10-16 NOTE — BH Assessment (Signed)
Assessment completed. Consulted with Alberteen SamFran Hobson, NP who recommends overnight observation and AM psych eval.   Davina PokeJoVea Lilith Solana, LCSW Therapeutic Triage Specialist [facility] 10/16/2015 2:09 AM

## 2015-10-17 DIAGNOSIS — R45851 Suicidal ideations: Secondary | ICD-10-CM | POA: Diagnosis not present

## 2015-10-17 LAB — URINE CULTURE

## 2015-10-17 NOTE — ED Notes (Addendum)
APS returned call, social work will be notified of situation, any further assistance after hours call [phone]

## 2015-10-17 NOTE — ED Notes (Signed)
Pt resting on stretcher, NAD, sitter at bedside

## 2015-10-17 NOTE — ED Notes (Signed)
Patient's guardian arrived at ED to pick up patient. Discharge instructions discussed with patient and guardian. Patient and guardian verbalized understanding.

## 2015-10-17 NOTE — ED Notes (Signed)
Pt resting on R side with eyes closed, RR even and unlabored, NAD, will continue to monitor. Sitter at bedside

## 2015-10-18 ENCOUNTER — Telehealth (HOSPITAL_COMMUNITY): Payer: Self-pay | Admitting: Emergency Medicine

## 2015-10-18 NOTE — ED Notes (Signed)
Called pt and spoke w/caretaker (Mr. Bishop DublinDarden) ... notified of recent lab results from visit 6/23 Reports pt is feeling better and sx have subsided and is taking antibiotics.  Adv to if sx are not getting better to return  Mr. Bishop DublinDarden verb understanding  Per Dr. Dayton ScrapeMurray,  Notes Recorded by Charm RingsErin J Honig, MD on 10/17/2015 at 6:44 PM Urine culture growing multiple species. Given his UA at his [facility] on 6/23, recommend that he complete the course of Keflex prescribed. Follow up if symptoms are persistent.

## 2015-11-04 ENCOUNTER — Emergency Department (EMERGENCY_DEPARTMENT_HOSPITAL)
Admission: EM | Admit: 2015-11-04 | Discharge: 2015-11-05 | Disposition: A | Discharge status: Home / Self Care | Payer: Medicare Other | Source: Home / Self Care | Attending: Emergency Medicine | Admitting: Emergency Medicine

## 2015-11-04 ENCOUNTER — Encounter (HOSPITAL_COMMUNITY): Payer: Self-pay | Admitting: Emergency Medicine

## 2015-11-04 DIAGNOSIS — F329 Major depressive disorder, single episode, unspecified: Secondary | ICD-10-CM | POA: Diagnosis present

## 2015-11-04 DIAGNOSIS — Z5181 Encounter for therapeutic drug level monitoring: Secondary | ICD-10-CM

## 2015-11-04 DIAGNOSIS — F4321 Adjustment disorder with depressed mood: Secondary | ICD-10-CM | POA: Diagnosis not present

## 2015-11-04 DIAGNOSIS — F918 Other conduct disorders: Secondary | ICD-10-CM

## 2015-11-04 DIAGNOSIS — F17200 Nicotine dependence, unspecified, uncomplicated: Secondary | ICD-10-CM

## 2015-11-04 LAB — CBC WITH DIFFERENTIAL/PLATELET
BASOS ABS: 0 10*3/uL (ref 0.0–0.1)
Basophils Relative: 0 %
Eosinophils Absolute: 0.1 10*3/uL (ref 0.0–0.7)
Eosinophils Relative: 1 %
HEMATOCRIT: 43.5 % (ref 39.0–52.0)
Hemoglobin: 13.9 g/dL (ref 13.0–17.0)
LYMPHS ABS: 1.3 10*3/uL (ref 0.7–4.0)
LYMPHS PCT: 29 %
MCH: 29.3 pg (ref 26.0–34.0)
MCHC: 32 g/dL (ref 30.0–36.0)
MCV: 91.6 fL (ref 78.0–100.0)
MONO ABS: 0.3 10*3/uL (ref 0.1–1.0)
Monocytes Relative: 7 %
NEUTROS ABS: 2.8 10*3/uL (ref 1.7–7.7)
Neutrophils Relative %: 63 %
Platelets: 173 10*3/uL (ref 150–400)
RBC: 4.75 MIL/uL (ref 4.22–5.81)
RDW: 13.5 % (ref 11.5–15.5)
WBC: 4.5 10*3/uL (ref 4.0–10.5)

## 2015-11-04 LAB — RAPID URINE DRUG SCREEN, HOSP PERFORMED
AMPHETAMINES: NOT DETECTED
BARBITURATES: NOT DETECTED
BENZODIAZEPINES: NOT DETECTED
Cocaine: NOT DETECTED
Opiates: NOT DETECTED
TETRAHYDROCANNABINOL: NOT DETECTED

## 2015-11-04 LAB — ETHANOL

## 2015-11-04 LAB — COMPREHENSIVE METABOLIC PANEL
ALT: 28 U/L (ref 17–63)
AST: 30 U/L (ref 15–41)
Albumin: 3.8 g/dL (ref 3.5–5.0)
Alkaline Phosphatase: 68 U/L (ref 38–126)
Anion gap: 4 — ABNORMAL LOW (ref 5–15)
BUN: 14 mg/dL (ref 6–20)
CHLORIDE: 105 mmol/L (ref 101–111)
CO2: 31 mmol/L (ref 22–32)
Calcium: 8.8 mg/dL — ABNORMAL LOW (ref 8.9–10.3)
Creatinine, Ser: 0.94 mg/dL (ref 0.61–1.24)
GFR calc Af Amer: >60 mL/min (ref >60)
Glucose, Bld: 99 mg/dL (ref 65–99)
POTASSIUM: 3.8 mmol/L (ref 3.5–5.1)
SODIUM: 140 mmol/L (ref 135–145)
Total Bilirubin: 0.4 mg/dL (ref 0.3–1.2)
Total Protein: 6.6 g/dL (ref 6.5–8.1)

## 2015-11-04 LAB — ACETAMINOPHEN LEVEL: Acetaminophen (Tylenol), Serum: <10 ug/mL — ABNORMAL LOW (ref 10–30)

## 2015-11-04 LAB — CBG MONITORING, ED: Glucose-Capillary: 123 mg/dL — ABNORMAL HIGH (ref 65–99)

## 2015-11-04 LAB — SALICYLATE LEVEL

## 2015-11-04 MED ORDER — LEVETIRACETAM 500 MG PO TABS
500.0000 mg | ORAL_TABLET | Freq: Two times a day (BID) | ORAL | Status: DC
  Administered 2015-11-04 – 2015-11-05 (×3): 500 mg via ORAL
  Filled 2015-11-04 (×3): qty 1

## 2015-11-04 MED ORDER — LINACLOTIDE 145 MCG PO CAPS
145.0000 ug | ORAL_CAPSULE | Freq: Every day | ORAL | Status: DC
  Administered 2015-11-05: 145 ug via ORAL
  Filled 2015-11-04 (×2): qty 1

## 2015-11-04 MED ORDER — TRAZODONE HCL 100 MG PO TABS
100.0000 mg | ORAL_TABLET | Freq: Every day | ORAL | Status: DC
  Administered 2015-11-04: 100 mg via ORAL
  Filled 2015-11-04: qty 1

## 2015-11-04 MED ORDER — CITALOPRAM HYDROBROMIDE 10 MG PO TABS
10.0000 mg | ORAL_TABLET | Freq: Every day | ORAL | Status: DC
  Administered 2015-11-04 – 2015-11-05 (×2): 10 mg via ORAL
  Filled 2015-11-04 (×2): qty 1

## 2015-11-04 MED ORDER — BACLOFEN 20 MG PO TABS
20.0000 mg | ORAL_TABLET | Freq: Two times a day (BID) | ORAL | Status: DC
  Administered 2015-11-04 – 2015-11-05 (×3): 20 mg via ORAL
  Filled 2015-11-04 (×3): qty 1

## 2015-11-04 NOTE — ED Notes (Signed)
Bed: [facility] Expected date:  Expected time:  Means of arrival:  Comments: EMS- 40yo M, bizarre behavior

## 2015-11-04 NOTE — ED Notes (Addendum)
Pt was brought from the main ER and placed in room 32. Pt has contracted hands and a slight speech impediment. [facility]ently the [facility], Jessie Footoyka, is speaking to the pt. He remains in his wheelchair and is cooperative. Pt remains with a sitter. Pt has some garbled speech and does use sign language to communicate. Phoned pharmacy to send pts medications. (2:30pm) Pts caregiver  , Bishop DublinDarden, phoned and stated someone left him a message. His contact number is : [phone].Toyka made aware. (2:50pm)4:10pm Pts caregiver, Bishop DublinDarden, phoned to inquire about pts disposition. Per Darden , the pt was requesting cigarettes everyday and when the pt. was not able to smoke he would become very angry and threaten to hurt himself. 7pm Pt c/o right calf pain. Calf is reddened and warm to the touch. EDP made aware. Unable to assess if pt has sustained any injury recently or in the past due to pts garbled speech. EDP will evaluate. Report to oncoming shift.

## 2015-11-04 NOTE — BH Assessment (Addendum)
Assessment Note  Luis Morales is an 40 y.o. male with history of a TBI. He presents to [facility] with IVC papers. Patient has no listed mental diagnosis but has been previously committed in [place]. Patient is in a wheelchair and rolled himself intothe street where it was potential traffic. Patient stated he doesn't care if he gets hit by a car he wants to die.  Patient stated he's tired of living the way he does in the chair. Patient stated he wants to go to the hospital or die. Patient has also reportedly threatened to break a window in home and asked for the police officers gun today. Patient admits that he done this before, rolled wheel chair in the street with hopes of getting hit (SEE [facility] visit 10/15/2015).   Patient is difficult to understand in his speech. He appears to know sign language. Nursing staff made aware that patient would probably benefit from a interpreter. Patient admits to SI and states "I don't want to live."Onset of symptoms started 1 month ago. Patient denies history of self injurious behaviors.  Patient denies HI and history of aggression. Patient denies AVH and does not appear to be responding to internal stimuli. Patient denies history of trauma/abuse and denies drug use. Patient UDS and BAL is clear at this time. He reports a history of 1 INPT hospitalization; patient unsure of date and facilities name.     Diagnosis: Major Depressive Disorder, Recurrent Severe, without psychotic features  History reviewed. No pertinent past surgical history.  Family History:  Family History  Problem Relation Age of Onset  . Family history unknown: Yes    Social History:  reports that he has been smoking.  He does not have any smokeless tobacco history on file. He reports that he does not drink alcohol or use illicit drugs.  Additional Social History:  Alcohol / Drug Use Pain Medications: See PTA Prescriptions: See PTA Over the Counter: See PTA History of alcohol / drug use?:  No history of alcohol / drug abuse  CIWA: CIWA-Ar BP: 125/85 mmHg Pulse Rate: 71 COWS:    Allergies: No Known Allergies  Home Medications:  (Not in a hospital admission)  OB/GYN Status:  No LMP for male patient.  General Assessment Data [facility] of Assessment: [facility] TTS Assessment: In system Is this a Tele or Face-to-Face Assessment?: Face-to-Face Is this an Initial Assessment or a Re-assessment for this encounter?: Initial Assessment Marital status: Single Is patient pregnant?: No Pregnancy Status: No Living Arrangements: Non-relatives/Friends Can pt return to current living arrangement?: Yes Admission Status: Voluntary Is patient capable of signing voluntary admission?: No Referral Source: Self/Family/Friend     Crisis Care Plan Living Arrangements: Non-relatives/Friends Legal Guardian: Other: (no legal guardian ) Name of Psychiatrist:  (no psychiatrist reported) Name of Therapist:  (no therapist reported)  Education Status Is patient currently in school?: No Current Grade:  (n/a) Highest grade of school patient has completed:  (unk) Name of school:  (n/a) Contact person:  (n/a)  Risk to self with the past 6 months Suicidal Ideation: No Has patient been a risk to self within the past 6 months prior to admission? : No Suicidal Intent: No Has patient had any suicidal intent within the past 6 months prior to admission? : No Is patient at risk for suicide?: No Suicidal Plan?: No Has patient had any suicidal plan within the past 6 months prior to admission? : No Access to Means: No What has been your use of drugs/alcohol within  the last 12 months?:  (denies ) Previous Attempts/Gestures: No How many times?:  (0) Other Self Harm Risks:  (denies ) Triggers for Past Attempts: None known Intentional Self Injurious Behavior: None Family Suicide History: No Recent stressful life event(s): Other (Comment) (n/a) Persecutory voices/beliefs?: No Depression:  Yes Depression Symptoms:  (unable to identify ) Substance abuse history and/or treatment for substance abuse?: No Suicide prevention information given to non-admitted patients: Not applicable  Risk to Others within the past 6 months Homicidal Ideation: No Does patient have any lifetime risk of violence toward others beyond the six months prior to admission? : No Thoughts of Harm to Others: No Current Homicidal Intent: No Current Homicidal Plan: No Access to Homicidal Means: No History of harm to others?: No Assessment of Violence: None Noted Does patient have access to weapons?: Yes (Comment) Criminal Charges Pending?: No Does patient have a court date: No Is patient on probation?: No  Psychosis Hallucinations: None noted Delusions: None noted  Mental Status Report Appearance/Hygiene: In scrubs Eye Contact: Good Motor Activity: Unable to assess Speech: Slurred Level of Consciousness: Alert Mood: Other (Comment) (Pleasant ) Affect: Appropriate to circumstance Anxiety Level: None Thought Processes: Coherent, Relevant Judgement: Unimpaired Orientation: Person, Place, Time, Situation, Appropriate for developmental age Obsessive Compulsive Thoughts/Behaviors: None  Cognitive Functioning Concentration: Unable to Assess Memory: Recent Intact, Remote Intact IQ: Average Insight: Fair Impulse Control: Fair Appetite: Good Weight Loss:  (n/a) Weight Gain:  (n/a) Sleep: Unable to Assess Total Hours of Sleep:  (n/a) Vegetative Symptoms: Unable to Assess  ADLScreening [facility] Assessment Services) Patient's cognitive ability adequate to safely complete daily activities?: No Patient able to express need for assistance with ADLs?: Yes Independently performs ADLs?: No  Prior Inpatient Therapy Prior Inpatient Therapy: Yes Prior Therapy Facilty/Provider(s): [facility]  Prior Outpatient Therapy Prior Outpatient Therapy: No Prior Therapy Dates: UTA Prior Therapy Facilty/Provider(s):  [facility] Reason for Treatment: UTA Does patient have an ACCT team?: Unknown Does patient have Intensive In-House Services?  : Unknown Does patient have [facility] services? : Unknown Does patient have P4CC services?: Unknown  ADL Screening (condition at time of admission) Patient's cognitive ability adequate to safely complete daily activities?: No Is the patient deaf or have difficulty hearing?: Yes Does the patient have difficulty seeing, even when wearing glasses/contacts?: No Does the patient have difficulty concentrating, remembering, or making decisions?: Yes Patient able to express need for assistance with ADLs?: Yes Does the patient have difficulty dressing or bathing?: Yes Independently performs ADLs?: No Does the patient have difficulty walking or climbing stairs?: No Weakness of Legs: None Weakness of Arms/Hands: None  Home Assistive Devices/Equipment Home Assistive Devices/Equipment: None    Abuse/Neglect Assessment (Assessment to be complete while patient is alone) Physical Abuse: Denies Verbal Abuse: Denies Sexual Abuse: Denies Exploitation of patient/patient's resources: Denies Self-Neglect: Denies Values / Beliefs Cultural Requests During Hospitalization: None Spiritual Requests During Hospitalization: None   Advance Directives (For Healthcare) Does patient have an advance directive?: No Would patient like information on creating an advanced directive?: No - patient declined information Nutrition Screen- [facility] Patient's home diet: Regular  Additional Information 1:1 In Past 12 Months?: No CIRT Risk: No Elopement Risk: No Does patient have medical clearance?: Yes     Disposition:  Disposition Initial Assessment Completed for this Encounter: Yes Disposition of Patient:  (observe overnight per Nanine MeansJamison Lord, DNP; re-evaluate in the ) Other disposition(s): Other (Comment) (Pending am re-evaluation )    Melynda Rippleerry, Dontae Minerva [facility] 11/04/2015 12:27 PM

## 2015-11-04 NOTE — ED Notes (Addendum)
Per EMS and [employer]. Pt has been acting out to get out of his living situation. Hx of TBI. Pt currently living with a temporary caregiver Aurelio Jew(Terell Darden [phone]). Pt was going down the street in his wheelchair with no regard to traffic upon [employer] arrival. Pt threatened to break a window and was asking for [employer]'s gun. Pt used to be in a Group home. Denied SI/HI to EMS.

## 2015-11-04 NOTE — ED Provider Notes (Signed)
21:40-  I was asked by nursing to evaluate the patient for red and swollen right lower leg. Patient complains of pain in both lower legs. He denies trauma to the right leg. He denies shortness of breath, weakness or dizziness. On exam, there is a small scab on the right knee. There is vague erythema of the right anterior shin region. This erythema is in region of prior scarring indicative of remote trauma. There is no calf tenderness or swelling. The right leg, it's marginally larger than the left. He is able to exhibit normal range of motion of the right lower leg, with some effort. Left leg, is nontender to palpation and is without deformity, erythema or swelling.  Medical decision making- nonspecific erythema to the right lower leg, with evidence for recent trauma (abrasion), indicating likely erythema from mild trauma. I have very low suspicion for DVT. Patient is being observed for possible psychiatric placement. We'll order DVT study for the morning with Doppler imaging. In the meantime, I asked the nurse to apply heat to the affected area to see if we can improve the erythema.  Mancel BaleElliott Evann Erazo, MD 11/04/15 2155

## 2015-11-04 NOTE — ED Provider Notes (Signed)
CSN: [id number]     Arrival date & time 11/04/15  0957 History   First MD Initiated Contact with Patient 11/04/15 1115     Chief Complaint  Patient presents with  . Aggressive Behavior   HPI   Luis Morales is a 40 y.o. male PMH significant for TBI presenting with a 2 week history of SI. When asked if there is a trigger for suicidal ideation, patient states, "I've been in a wheelchair for 27 years." Per triage note, patient was going down the street in his wheelchair with no regard to traffic upon [employer] arrival. Patient admits to threatening to break a window and was asking for [employer]'s gun. He denies HI, hallucinations.  He states he is living in an assisted living facility.  Past Medical History  Diagnosis Date  . UTI (urinary tract infection)   . Traumatic brain injury [facility])    History reviewed. No pertinent past surgical history. Family History  Problem Relation Age of Onset  . Family history unknown: Yes   Social History  Substance Use Topics  . Smoking status: Current Every Day Smoker  . Smokeless tobacco: None  . Alcohol Use: No    Review of Systems  Ten systems are reviewed and are negative for acute change except as noted in the HPI  Allergies  Review of patient's allergies indicates no known allergies.  Home Medications   Prior to Admission medications   Medication Sig Start Date End Date Taking? Authorizing Provider  baclofen (LIORESAL) 10 MG tablet Take 20 mg by mouth 2 (two) times daily.     Historical Provider, MD  cephALEXin (KEFLEX) 500 MG capsule Take 1 capsule (500 mg total) by mouth 4 (four) times daily. 10/15/15   Hayden Rasmussen, NP  levETIRAcetam (KEPPRA) 500 MG tablet Take 500 mg by mouth 2 (two) times daily.    Historical Provider, MD  linaclotide (LINZESS) 145 MCG CAPS capsule Take 145 mcg by mouth daily before breakfast.    Historical Provider, MD  trimethoprim (TRIMPEX) 100 MG tablet Take 100 mg by mouth 2 (two) times daily.    Historical Provider, MD    BP 123/71 mmHg  Pulse 65  Temp(Src) 98.3 F (36.8 C) (Oral)  Resp 16  SpO2 95% Physical Exam  Constitutional: He appears well-developed and well-nourished.  HENT:  Head: Normocephalic.  Eyes: Conjunctivae and EOM are normal. Right eye exhibits no discharge. Left eye exhibits no discharge.  Cardiovascular: Normal rate.   Pulmonary/Chest: Effort normal.  Abdominal: Soft. He exhibits no distension.  Neurological: He is alert.  Slow speech that is somewhat difficult to interpret (h/o TBI; appears baseline per record review).   Skin: Skin is warm and dry.  Psychiatric: He has a normal mood and affect. His behavior is normal.    ED Course  Procedures  Labs Review Labs Reviewed  COMPREHENSIVE METABOLIC PANEL - Abnormal; Notable for the following:    Calcium 8.8 (*)    Anion gap 4 (*)    All other components within normal limits  ACETAMINOPHEN LEVEL - Abnormal; Notable for the following:    Acetaminophen (Tylenol), Serum <10 (*)    All other components within normal limits  CBG MONITORING, ED - Abnormal; Notable for the following:    Glucose-Capillary 123 (*)    All other components within normal limits  ETHANOL  CBC WITH DIFFERENTIAL/PLATELET  URINE RAPID DRUG SCREEN, HOSP PERFORMED  SALICYLATE LEVEL   MDM   Final diagnoses:  None   Patient medically  cleared at this time. Home meds reordered. TTS consulted.   Melton KrebsSamantha Nicole Rogene Meth, PA-C 11/04/15 1739  Raeford RazorStephen Kohut, MD 11/05/15 [phone]

## 2015-11-04 NOTE — ED Notes (Signed)
Bed: WA32 Expected date:  Expected time:  Means of arrival:  Comments: Triage 4 

## 2015-11-05 ENCOUNTER — Emergency Department (HOSPITAL_COMMUNITY)
Admission: EM | Admit: 2015-11-05 | Discharge: 2015-11-11 | Disposition: A | Discharge status: Home / Self Care | Payer: Medicare Other | Attending: Psychiatry | Admitting: Psychiatry

## 2015-11-05 ENCOUNTER — Emergency Department (HOSPITAL_BASED_OUTPATIENT_CLINIC_OR_DEPARTMENT_OTHER): Payer: Medicare Other

## 2015-11-05 ENCOUNTER — Encounter (HOSPITAL_COMMUNITY): Payer: Self-pay

## 2015-11-05 DIAGNOSIS — R609 Edema, unspecified: Secondary | ICD-10-CM

## 2015-11-05 DIAGNOSIS — M7989 Other specified soft tissue disorders: Secondary | ICD-10-CM | POA: Diagnosis not present

## 2015-11-05 DIAGNOSIS — F4321 Adjustment disorder with depressed mood: Secondary | ICD-10-CM | POA: Diagnosis not present

## 2015-11-05 DIAGNOSIS — F17200 Nicotine dependence, unspecified, uncomplicated: Secondary | ICD-10-CM | POA: Insufficient documentation

## 2015-11-05 MED ORDER — LEVETIRACETAM 500 MG PO TABS
500.0000 mg | ORAL_TABLET | Freq: Two times a day (BID) | ORAL | Status: DC
  Administered 2015-11-05 – 2015-11-11 (×12): 500 mg via ORAL
  Filled 2015-11-05 (×13): qty 1

## 2015-11-05 MED ORDER — LINACLOTIDE 145 MCG PO CAPS
145.0000 ug | ORAL_CAPSULE | Freq: Every day | ORAL | Status: DC
  Administered 2015-11-06 – 2015-11-11 (×5): 145 ug via ORAL
  Filled 2015-11-05 (×8): qty 1

## 2015-11-05 MED ORDER — BACLOFEN 20 MG PO TABS
20.0000 mg | ORAL_TABLET | Freq: Two times a day (BID) | ORAL | Status: DC
  Administered 2015-11-05 – 2015-11-11 (×12): 20 mg via ORAL
  Filled 2015-11-05 (×13): qty 1

## 2015-11-05 NOTE — Consult Note (Signed)
[facility] Consult   Reason for Consult:  Suicidal ideations Referring Physician:  EDP Patient Identification: Luis Morales MRN:  [id number] Principal Diagnosis: Adjustment disorder with depressed mood Diagnosis:   Patient Active Problem List   Diagnosis Date Noted  . Adjustment disorder with depressed mood [F43.21] 10/16/2015    Priority: High  . TBI (traumatic brain injury) ([place]) [S06.9X9A] 10/16/2015  . Suicidal ideations [R45.851]     Total Time spent with patient: 45 minutes  Subjective:   Luis Morales is a 40 y.o. male patient does not warrant admission.  HPI:  40 yo who became upset with his group home with suicidal ideations but denied to police and ED.  Today, he is smiling and pleasant.  No suicidal/homicidal ideations, hallucinations, and alcohol/drug abuse.  Stable to return to his group home.  Past Psychiatric History: TBI  Risk to Self: Suicidal Ideation: No Suicidal Intent: No Is patient at risk for suicide?: No Suicidal Plan?: No Access to Means: No What has been your use of drugs/alcohol within the last 12 months?:  (denies ) How many times?:  (0) Other Self Harm Risks:  (denies ) Triggers for Past Attempts: None known Intentional Self Injurious Behavior: None Risk to Others: Homicidal Ideation: No Thoughts of Harm to Others: No Current Homicidal Intent: No Current Homicidal Plan: No Access to Homicidal Means: No History of harm to others?: No Assessment of Violence: None Noted Does patient have access to weapons?: Yes (Comment) Criminal Charges Pending?: No Does patient have a court date: No Prior Inpatient Therapy: Prior Inpatient Therapy: Yes Prior Therapy Facilty/Provider(s): [facility] Prior Outpatient Therapy: Prior Outpatient Therapy: No Prior Therapy Dates: UTA Prior Therapy Facilty/Provider(s): UTA Reason for Treatment: UTA Does patient have an ACCT team?: Unknown Does patient have Intensive In-House Services?  :  Unknown Does patient have [facility] services? : Unknown Does patient have P4CC services?: Unknown  Past Medical History:  Past Medical History  Diagnosis Date  . UTI (urinary tract infection)   . Traumatic brain injury [facility])    History reviewed. No pertinent past surgical history. Family History:  Family History  Problem Relation Age of Onset  . Family history unknown: Yes   Family Psychiatric  History: none  Social History:  History  Alcohol Use No     History  Drug Use No    Social History   Social History  . Marital Status: Single    Spouse Name: N/A  . Number of Children: N/A  . Years of Education: N/A   Social History Main Topics  . Smoking status: Current Every Day Smoker  . Smokeless tobacco: None  . Alcohol Use: No  . Drug Use: No  . Sexual Activity: Not Asked   Other Topics Concern  . None   Social History Narrative   Additional Social History:    Allergies:  No Known Allergies  Labs:  Results for orders placed or performed during the hospital encounter of 11/04/15 (from the past 48 hour(s))  Comprehensive metabolic panel     Status: Abnormal   Collection Time: 11/04/15  3:38 PM  Result Value Ref Range   Sodium 140 135 - 145 mmol/L   Potassium 3.8 3.5 - 5.1 mmol/L   Chloride 105 101 - 111 mmol/L   CO2 31 22 - 32 mmol/L   Glucose, Bld 99 65 - 99 mg/dL   BUN 14 6 - 20 mg/dL   Creatinine, Ser 0.94 0.61 - 1.24 mg/dL   Calcium 8.8 (L)  8.9 - 10.3 mg/dL   Total Protein 6.6 6.5 - 8.1 g/dL   Albumin 3.8 3.5 - 5.0 g/dL   AST 30 15 - 41 U/L   ALT 28 17 - 63 U/L   Alkaline Phosphatase 68 38 - 126 U/L   Total Bilirubin 0.4 0.3 - 1.2 mg/dL   GFR calc non Af Amer >60 >60 mL/min   GFR calc Af Amer >60 >60 mL/min    Comment: (NOTE) The eGFR has been calculated using the CKD EPI equation. This calculation has not been validated in all clinical situations. eGFR's persistently <60 mL/min signify possible Chronic Kidney Disease.    Anion gap 4 (L) 5 - 15   Ethanol     Status: None   Collection Time: 11/04/15  3:38 PM  Result Value Ref Range   Alcohol, Ethyl (B) <5 <5 mg/dL    Comment:        LOWEST DETECTABLE LIMIT FOR SERUM ALCOHOL IS 5 mg/dL FOR MEDICAL PURPOSES ONLY   CBC with Diff     Status: None   Collection Time: 11/04/15  3:38 PM  Result Value Ref Range   WBC 4.5 4.0 - 10.5 K/uL   RBC 4.75 4.22 - 5.81 MIL/uL   Hemoglobin 13.9 13.0 - 17.0 g/dL   HCT 43.5 39.0 - 52.0 %   MCV 91.6 78.0 - 100.0 fL   MCH 29.3 26.0 - 34.0 pg   MCHC 32.0 30.0 - 36.0 g/dL   RDW 13.5 11.5 - 15.5 %   Platelets 173 150 - 400 K/uL   Neutrophils Relative % 63 %   Neutro Abs 2.8 1.7 - 7.7 K/uL   Lymphocytes Relative 29 %   Lymphs Abs 1.3 0.7 - 4.0 K/uL   Monocytes Relative 7 %   Monocytes Absolute 0.3 0.1 - 1.0 K/uL   Eosinophils Relative 1 %   Eosinophils Absolute 0.1 0.0 - 0.7 K/uL   Basophils Relative 0 %   Basophils Absolute 0.0 0.0 - 0.1 K/uL  Salicylate level     Status: None   Collection Time: 11/04/15  3:38 PM  Result Value Ref Range   Salicylate Lvl <3.4 2.8 - 30.0 mg/dL  Acetaminophen level     Status: Abnormal   Collection Time: 11/04/15  3:38 PM  Result Value Ref Range   Acetaminophen (Tylenol), Serum <10 (L) 10 - 30 ug/mL    Comment:        THERAPEUTIC CONCENTRATIONS VARY SIGNIFICANTLY. A RANGE OF 10-30 ug/mL MAY BE AN EFFECTIVE CONCENTRATION FOR MANY PATIENTS. HOWEVER, SOME ARE BEST TREATED AT CONCENTRATIONS OUTSIDE THIS RANGE. ACETAMINOPHEN CONCENTRATIONS >150 ug/mL AT 4 HOURS AFTER INGESTION AND >50 ug/mL AT 12 HOURS AFTER INGESTION ARE OFTEN ASSOCIATED WITH TOXIC REACTIONS.   Urine rapid drug screen (hosp performed)not at [facility]     Status: None   Collection Time: 11/04/15  4:40 PM  Result Value Ref Range   Opiates NONE DETECTED NONE DETECTED   Cocaine NONE DETECTED NONE DETECTED   Benzodiazepines NONE DETECTED NONE DETECTED   Amphetamines NONE DETECTED NONE DETECTED   Tetrahydrocannabinol NONE DETECTED NONE  DETECTED   Barbiturates NONE DETECTED NONE DETECTED    Comment:        DRUG SCREEN FOR MEDICAL PURPOSES ONLY.  IF CONFIRMATION IS NEEDED FOR ANY PURPOSE, NOTIFY LAB WITHIN 5 DAYS.        LOWEST DETECTABLE LIMITS FOR URINE DRUG SCREEN Drug Class       Cutoff (ng/mL) Amphetamine  1000 Barbiturate      200 Benzodiazepine   007 Tricyclics       622 Opiates          300 Cocaine          300 THC              50   CBG monitoring, ED     Status: Abnormal   Collection Time: 11/04/15  5:33 PM  Result Value Ref Range   Glucose-Capillary 123 (H) 65 - 99 mg/dL   Comment 1 Notify RN     Current Facility-Administered Medications  Medication Dose Route Frequency Provider Last Rate Last Dose  . baclofen (LIORESAL) tablet 20 mg  20 mg Oral BID [place] Lions, PA-C   20 mg at 11/05/15 0947  . citalopram (CELEXA) tablet 10 mg  10 mg Oral Daily Patrecia Pour, NP   10 mg at 11/05/15 0947  . levETIRAcetam (KEPPRA) tablet 500 mg  500 mg Oral BID [place] Lions, PA-C   500 mg at 11/05/15 0947  . linaclotide (LINZESS) capsule 145 mcg  145 mcg Oral QAC breakfast  Lions, [place]   145 mcg at 11/05/15 0813  . traZODone (DESYREL) tablet 100 mg  100 mg Oral QHS Patrecia Pour, NP   100 mg at 11/04/15 2207   Current Outpatient Prescriptions  Medication Sig Dispense Refill  . baclofen (LIORESAL) 10 MG tablet Take 20 mg by mouth 2 (two) times daily.     Marland Kitchen levETIRAcetam (KEPPRA) 500 MG tablet Take 500 mg by mouth 2 (two) times daily.    Marland Kitchen linaclotide (LINZESS) 145 MCG CAPS capsule Take 290 mcg by mouth daily before breakfast.     . trimethoprim (TRIMPEX) 100 MG tablet Take 100 mg by mouth daily.     . cephALEXin (KEFLEX) 500 MG capsule Take 1 capsule (500 mg total) by mouth 4 (four) times daily. (Patient not taking: Reported on 11/04/2015) 40 capsule 0    Musculoskeletal: Strength & Muscle Tone: decreased and atrophy Gait & Station: normal, unable to stand Patient leans:  N/A  Psychiatric Specialty Exam: Physical Exam  Constitutional: He is oriented to person, place, and time. He appears well-developed and well-nourished.  HENT:  Head: Normocephalic.  Neck: Normal range of motion.  Respiratory: Effort normal.  Musculoskeletal: Normal range of motion.  Neurological: He is alert and oriented to person, place, and time.  Skin: Skin is warm and dry.  Psychiatric: He has a normal mood and affect. His speech is normal and behavior is normal. Judgment and thought content normal. Cognition and memory are normal.    Review of Systems  Constitutional: Negative.   HENT: Negative.   Eyes: Negative.   Respiratory: Negative.   Cardiovascular: Negative.   Gastrointestinal: Negative.   Genitourinary: Negative.   Musculoskeletal: Negative.   Skin: Negative.   Neurological: Negative.   Endo/Heme/Allergies: Negative.   Psychiatric/Behavioral: Negative.     Blood pressure 108/59, pulse 60, temperature 97.6 F (36.4 C), temperature source Oral, resp. rate 20, SpO2 97 %.There is no height or weight on file to calculate BMI.  General Appearance: Casual  Eye Contact:  Good  Speech:  minimal  Volume:  Normal  Mood:  Euthymic  Affect:  Congruent  Thought Process:  Coherent and Descriptions of Associations: Intact  Orientation:  Full (Time, Place, and Person)  Thought Content:  WDL  Suicidal Thoughts:  No  Homicidal Thoughts:  No  Memory:  Immediate;   Fair  Recent;   Fair Remote;   Fair  Judgement:  Fair  Insight:  Fair  Psychomotor Activity:  Normal  Concentration:  Concentration: Good and Attention Span: Good  Recall:  [employer] of Knowledge:  Fair  Language:  minimal but uses a Data processing manager  Akathisia:  No  Handed:  Right  AIMS (if indicated):     Assets:  Housing Leisure Time Resilience Social Support  ADL's:  Intact  Cognition:  WNL  Sleep:        Treatment Plan Summary: Daily contact with patient to assess and evaluate symptoms and  progress in treatment, Medication management and Plan adjustment disorder with depressed mood:  -Crisis stabilization -Medication management:  Continue medical medications and start Celexa 10 mg daily for depression and Trazodone 100 mg at bedtime for sleep -Individual counseling  Disposition: No evidence of imminent risk to self or others at present.    Waylan Boga, NP 11/05/2015 9:52 AM Patient seen face-to-face for psychiatric evaluation, chart reviewed and case discussed with the physician extender and developed treatment plan. Reviewed the information documented and agree with the treatment plan. Corena Pilgrim, MD

## 2015-11-05 NOTE — Progress Notes (Signed)
VASCULAR LAB PRELIMINARY  PRELIMINARY  PRELIMINARY  PRELIMINARY  Right lower extremity venous duplex has been completed.     Right:  No evidence of DVT, superficial thrombosis, or Baker's cyst.   Jenetta Logesami Ichiro Chesnut, RVT, RDMS 11/05/2015, 9:33 AM
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
## 2015-11-05 NOTE — BH Assessment (Signed)
Consulted with Maryjean Mornharles Kober, PA-C who states that patient does not meet inpatient criteria at this time.  Patient will be observed overnight and evaluated by psychiatry in the morning.   Luis PokeJoVea Tmya Wigington, LCSW Therapeutic Triage Specialist [facility] 11/05/2015 11:47 PM

## 2015-11-05 NOTE — ED Notes (Signed)
Difficult to communicate with.  Garbled speech.  Fluids given and urinal offered.

## 2015-11-05 NOTE — [facility] Suicide Risk Assessment (Signed)
Suicide Risk Assessment  Discharge Assessment   [facility]   Principal Problem: Adjustment disorder with depressed mood Discharge Diagnoses:  Patient Active Problem List   Diagnosis Date Noted  . Adjustment disorder with depressed mood [F43.21] 10/16/2015    Priority: High  . TBI (traumatic brain injury) ([facility]) [S06.9X9A] 10/16/2015  . Suicidal ideations [R45.851]     Total Time spent with patient: 45 minutes  Musculoskeletal: Strength & Muscle Tone: decreased and atrophy Gait & Station: normal, unable to stand Patient leans: N/A  Psychiatric Specialty Exam: Physical Exam  Constitutional: He is oriented to person, place, and time. He appears well-developed and well-nourished.  HENT:  Head: Normocephalic.  Neck: Normal range of motion.  Respiratory: Effort normal.  Musculoskeletal: Normal range of motion.  Neurological: He is alert and oriented to person, place, and time.  Skin: Skin is warm and dry.  Psychiatric: He has a normal mood and affect. His speech is normal and behavior is normal. Judgment and thought content normal. Cognition and memory are normal.    Review of Systems  Constitutional: Negative.   HENT: Negative.   Eyes: Negative.   Respiratory: Negative.   Cardiovascular: Negative.   Gastrointestinal: Negative.   Genitourinary: Negative.   Musculoskeletal: Negative.   Skin: Negative.   Neurological: Negative.   Endo/Heme/Allergies: Negative.   Psychiatric/Behavioral: Negative.     Blood pressure 108/59, pulse 60, temperature 97.6 F (36.4 C), temperature source Oral, resp. rate 20, SpO2 97 %.There is no height or weight on file to calculate BMI.  General Appearance: Casual  Eye Contact:  Good  Speech:  minimal  Volume:  Normal  Mood:  Euthymic  Affect:  Congruent  Thought Process:  Coherent and Descriptions of Associations: Intact  Orientation:  Full (Time, Place, and Person)  Thought Content:  WDL  Suicidal Thoughts:  No   Homicidal Thoughts:  No  Memory:  Immediate;   Fair Recent;   Fair Remote;   Fair  Judgement:  Fair  Insight:  Fair  Psychomotor Activity:  Normal  Concentration:  Concentration: Good and Attention Span: Good  Recall:  [employer]Fair  Fund of Knowledge:  Fair  Language:  minimal but uses a Public affairs consultantcommunication board  Akathisia:  No  Handed:  Right  AIMS (if indicated):     Assets:  Housing Leisure Time Resilience Social Support  ADL's:  Intact  Cognition:  WNL  Sleep:       Mental Status Per Nursing Assessment::   On Admission:   suicidal ideations  Demographic Factors:  Male and Caucasian  Loss Factors: NA  Historical Factors: NA  Risk Reduction Factors:   Sense of responsibility to family, Living with another person, especially a relative, Positive social support and Positive therapeutic relationship  Continued Clinical Symptoms:  None  Cognitive Features That Contribute To Risk:  None    Suicide Risk:  Minimal: No identifiable suicidal ideation.  Patients presenting with no risk factors but with morbid ruminations; may be classified as minimal risk based on the severity of the depressive symptoms    Plan Of Care/Follow-up recommendations:  Activity:  as tolerated Diet:  heart healthy diet  Monserrat Vidaurri, NP 11/05/2015, 11:20 AM

## 2015-11-05 NOTE — ED Notes (Signed)
Mr. Luis Morales was called to say he could come pick patient up.  He stated a staff member named Jill AlexandersJustin will come to get patient.
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
## 2015-11-05 NOTE — ED Notes (Signed)
Belongings to locker32.
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
## 2015-11-06 DIAGNOSIS — F4321 Adjustment disorder with depressed mood: Secondary | ICD-10-CM | POA: Diagnosis not present

## 2015-11-06 MED ORDER — MAGNESIUM HYDROXIDE 400 MG/5ML PO SUSP
30.0000 mL | Freq: Every day | ORAL | Status: DC | PRN
  Administered 2015-11-06: 30 mL via ORAL
  Filled 2015-11-06 (×2): qty 30

## 2015-11-06 MED ORDER — TRAZODONE HCL 50 MG PO TABS
50.0000 mg | ORAL_TABLET | Freq: Every day | ORAL | Status: DC
  Administered 2015-11-06 – 2015-11-10 (×5): 50 mg via ORAL
  Filled 2015-11-06 (×5): qty 1

## 2015-11-06 MED ORDER — NICOTINE 21 MG/24HR TD PT24
21.0000 mg | MEDICATED_PATCH | Freq: Every day | TRANSDERMAL | Status: DC
Start: 2015-11-06 — End: 2015-11-11
  Administered 2015-11-06 – 2015-11-10 (×4): 21 mg via TRANSDERMAL
  Filled 2015-11-06 (×5): qty 1

## 2015-11-06 MED ORDER — CITALOPRAM HYDROBROMIDE 10 MG PO TABS
10.0000 mg | ORAL_TABLET | Freq: Every day | ORAL | Status: DC
  Administered 2015-11-06 – 2015-11-11 (×6): 10 mg via ORAL
  Filled 2015-11-06 (×6): qty 1
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
## 2015-11-06 NOTE — ED Notes (Signed)
Pt smiling and pleasant. No aggression witnessed throughout this nurse's shift.

## 2015-11-06 NOTE — BH Assessment (Signed)
DSS on call social worker returned call and completed report.   Davina PokeJoVea Amit Meloy, LCSW Therapeutic Triage Specialist [facility] 11/06/2015 12:22 AM
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
## 2015-11-06 NOTE — Progress Notes (Addendum)
Pt is asleep on his left side with regular respirations. He remains cooperative and non-aggressive. Pt remains with a sitter and all four siderails remain up. Pt ate 100% of breakfast. Pt initially refused his am meds but then did take them with much prompting.Pt continues to have garbled speech and attempts to communicate with sign language-Pt spoke to social work and stated he did not want to go back and live with [facility]. Social  work is seeking placement for the pt. Phoned dietary to send bottled water with each meal to the pt. (12noon)Pt appears to have attention seeking behaviors towards females. He has been putting his call light on frequently. (3pm)Report to oncoming shift.

## 2015-11-06 NOTE — ED Notes (Signed)
Patient has been calm and cooperative this shift.  No complaints and NAD.  Support and encouragement offered andd Recruitment consultantsafety sitter continued.

## 2015-11-07 DIAGNOSIS — F4321 Adjustment disorder with depressed mood: Secondary | ICD-10-CM | POA: Diagnosis not present

## 2015-11-07 MED ORDER — LORAZEPAM 1 MG PO TABS
1.0000 mg | ORAL_TABLET | Freq: Four times a day (QID) | ORAL | Status: DC | PRN
  Administered 2015-11-07: 1 mg via ORAL
  Filled 2015-11-07: qty 1

## 2015-11-08 DIAGNOSIS — F4321 Adjustment disorder with depressed mood: Secondary | ICD-10-CM | POA: Diagnosis not present

## 2015-11-08 MED ORDER — TRIMETHOPRIM 100 MG PO TABS: 100.0000 mg | ORAL_TABLET | Freq: Every day | ORAL | Status: DC

## 2015-11-08 MED ORDER — LORAZEPAM 1 MG PO TABS: 1.0000 mg | ORAL_TABLET | Freq: Four times a day (QID) | ORAL | Status: DC | PRN

## 2015-11-08 MED ORDER — TRAZODONE HCL 50 MG PO TABS: 50.0000 mg | ORAL_TABLET | Freq: Every day | ORAL | Status: AC

## 2015-11-08 MED ORDER — LEVETIRACETAM 500 MG PO TABS: 500.0000 mg | ORAL_TABLET | Freq: Two times a day (BID) | ORAL | Status: AC

## 2015-11-08 MED ORDER — LINACLOTIDE 145 MCG PO CAPS: 145.0000 ug | ORAL_CAPSULE | Freq: Every day | ORAL | Status: AC

## 2015-11-08 MED ORDER — BACLOFEN 10 MG PO TABS: 20.0000 mg | ORAL_TABLET | Freq: Two times a day (BID) | ORAL | Status: AC

## 2015-11-08 MED ORDER — CITALOPRAM HYDROBROMIDE 10 MG PO TABS: 10.0000 mg | ORAL_TABLET | Freq: Every day | ORAL | Status: AC

## 2015-11-08 NOTE — ED Notes (Signed)
Bed: [id number] Expected date:  Expected time:  Means of arrival:  Comments: Hold for tcu

## 2015-11-08 NOTE — Progress Notes (Addendum)
CSW attempted a call to Luis Morales, [place] [facility] [facility]. Message left with name and contact number for return call.  CSW spoke with [place] FederationErlanda at [facility] [phone]. She stated she would have to call back with information regarding patient's new placement. She stated patient's belongings needed to be moved to his new placement and she did not know if that would be today. She stated she would call CSW back once she spoke with her staff at the agency. Number was provided for a return call.   Call received from Luis Morales, Legal Guardian who states [facility] aware of patient being cleared for discharge. She stated someone from the [facility] ([facility] Provider or [facility] would contact CSW. She stated a call could be made to MaywoodErlanda or [place] [phone]. She stated patient would be going to [facility].   Call received from [place]landa with [facility] who states she will call back after she speaks with someone at the agency to see if patient will be allowed to go to this facility.   Call received from [facility] with [facility] who states she spoke with the Legal Guardian. She stated the husband at [facility] is consulting with his wife regarding accepting patient, as she states the husband is unsure if they will receive funding for services provided for this patient. Nurse updated.  Attempted telephone call to Luis Morales, Legal Guardian, [facility] [place] [facility] to follow-up on placement. Message left with name and contact for return call.  Call received from Luis Morales, Legal Guardian who stated the placement for patient fell through and [facility] not looking for another placement for patient. She stated she has sent out emails to various agencies for placement and will update CSW. Nurse updated.    Elenore PaddyLaVonia Klair Leising, LCSWA [phone] ED CSW 11/08/2015 11:22 AM

## 2015-11-08 NOTE — ED Notes (Signed)
Wheelchair placed in nurse's station at 9-12 with backpack and one bag of clothing in chair. All appropriately labeled.
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
## 2015-11-09 DIAGNOSIS — F4321 Adjustment disorder with depressed mood: Secondary | ICD-10-CM | POA: Diagnosis not present

## 2015-11-09 LAB — URINALYSIS, ROUTINE W REFLEX MICROSCOPIC
BILIRUBIN URINE: NEGATIVE
GLUCOSE, UA: NEGATIVE mg/dL
Hgb urine dipstick: NEGATIVE
KETONES UR: NEGATIVE mg/dL
LEUKOCYTES UA: NEGATIVE
NITRITE: NEGATIVE
PH: 5.5 (ref 5.0–8.0)
PROTEIN: NEGATIVE mg/dL
Specific Gravity, Urine: 1.02 (ref 1.005–1.030)

## 2015-11-09 NOTE — Progress Notes (Signed)
CSW received a call from Roxana HiresMichelle Juchatz, Social Worker, [facility]. She stated she has sent patient's information to another [facility] facility. She stated patient has the funding for this facility and she will call CSW back if accepted. She stated [facility] has not completed a 30 day eviction nor have they taken charges against patient.    Elenore PaddyLaVonia Erven Ramson, LCSWA [phone] ED CSW 11/09/2015 8:47 AM

## 2015-11-09 NOTE — Progress Notes (Signed)
[name] owner of [facility]/ [phone] [facility] to [facility] patient to determine if he would be an appropriate fit for his facility. CSW was present. Group Home staff member was present.   CSW put translator machine in the room for american sign language. However, the interpreter states that the patient did not do ASL well. Patient informed interpreter that he felt comfortable speaking with group home owner without his help.   Luis Morales states that he will accept the patient. He states that the patient will be ready for the patient tomorrow at 2:00pm or Thursday evening around 2:00pm. Luis Morales states that upon picking the patient up he will need medication scripts, discharge summary, and and FL2. Also, he mentioned that pt may need a wheelchair.  CSW will create and FL2 for patient. Also, CSW will reach out to Nurse CM to inquire if she will be able to assist with patient getting a wheelchair.   Trish MageBrittney Akshita Morales, LCSWA [phone] ED CSW 11/09/2015 6:08 PM

## 2015-11-09 NOTE — Progress Notes (Signed)
[facility] by [facility]DSW to obtain wheelchair for patient to be used at [facility].  Patient is for possible discharge 0n 07/18 on at or before 2pm.  Patient with Medicaid insurance.  Patient is 5'9 approximately 165 pounds per EDRN.  Patient with TBI, contractures both hands, only able to stand and pivot per EDRN.  EDCM discussed patient with Dr. Verdie MosherLiu who kindly placed order for light manuel wheelchair.  [facility] text notified [facility] rep Jermaine about need for wheelchair to be delivered to patient's room in the am so that patient will have it upon discharge.  No further EDCM needs at this time.

## 2015-11-09 NOTE — Progress Notes (Addendum)
CSW received a telephone call from Georgina Peererrick McDowell with [facility]. He stated he would come today at 12:00 to assess patient.  CSW spoke with Georgina Peererrick McDowell who states he [facility] around 2:00[facility] to assess patient. He stated he should be able to accept patient at his facility. He stated he had spoken with the Legal Guardian and the Care Coordinator, as he wants to ensure he receives funding for the placement.   Elenore PaddyLaVonia Milo Solana, LCSWA [phone] ED CSW 11/09/2015 9:24 AM

## 2015-11-09 NOTE — ED Notes (Addendum)
Pt was brought back from the main ER. He requested cranberry juice. All four side rails are up. Pt remains safe. Pt requested a urinal. At times his speech is garbled and difficult to understand. Tacey HeapLavonia made aware pt has his own Wheelchair here. Pt at time scan be very need and repeatedly puts his call light on. Pt voided 100cc in his urinal. Pt received a bed bath by tech and nurse.(10:10am )Pt c/o burning with voiding. PA aware and ordered a urine. (1:20pm )pt informed we need a urine . Urinal at the bedside. 3:40pm Urine obtained and sent -straw colored w/o sediment..4pm Report to oncoming shift.

## 2015-11-09 NOTE — ED Notes (Signed)
Bed: [id number] Expected date:  Expected time:  Means of arrival:  Comments: Hold for room 12

## 2015-11-09 NOTE — Progress Notes (Signed)
Patient suffers from TBI, contractures which impairs their ability to perform daily activities like bathing, dressing, feeding and grooming in the home. A crutch or walker will not resolve  issue with performing activities of daily living. A wheelchair will allow patient to safely perform daily activities. Patient is not able to propel themselves in the home using a standard weight wheelchair due to endurance and general weakness. Patient can self propel in the lightweight wheelchair.  Accessories: elevating leg rests (ELRs), wheel locks, extensions and anti-tippers.

## 2015-11-10 DIAGNOSIS — F4321 Adjustment disorder with depressed mood: Secondary | ICD-10-CM | POA: Diagnosis not present

## 2015-11-10 LAB — URINE CULTURE

## 2015-11-10 NOTE — Progress Notes (Addendum)
Pt is pleasant and cooperative this am. He ate 100% of his breakfast. Pt has already received a bed bathe. Pt denies SI and HI. Pt OOB in the cardiac chair. Nurses call bell within reach. Wheelchair dropped off by [facility]. (10:40am )Pt asked the nurse to evaluate a hard lump on his right flank area with some green bruising to the left . PA evaluated the pt and stated he felt it would be fine. Pt denies any pain or injury, (4:25pm)Pt still c/o some burning with voiding. EDP made aware (6:10pm) specimen back from last night. Urine culture obtained and sent. (7pm)Report to oncoming shift.

## 2015-11-10 NOTE — Progress Notes (Addendum)
Call received from Charge RN regarding disposition. CSW informed her per 2nd Shift CSW note from yesterday that patient may be picked up today at 2:00p or tomorrow around 2:00p and that a call could be made to the group home owner.  10:00am- Attempted call to Luis Morales with [facility] to see if patient would be picked up on today. Message left with name and contact for return call. Charge RN updated.  12:07am- Attempted call to Luis Morales with [facility] to see if patient would be picked up on today. Message left with name and contact for return call. Charge RN updated.    Luis Morales, LCSWA [phone] ED CSW 11/10/2015 12:49 PM

## 2015-11-10 NOTE — NC FL2 (Signed)
[place] MEDICAID FL2 LEVEL OF CARE SCREENING TOOL     IDENTIFICATION  Patient Name: Luis Morales Birthdate: 1975-12-24 Sex: male Admission Date (Current Location): 11/05/2015  [facility] and [place] Number:  Producer, television/film/video and Address:  [facility],  [address], [place] [address]      Provider Number: [id number]  Attending Physician Name and Address:  Provider Default, MD  Relative Name and Phone Number:       Current Level of Care: Hospital Recommended Level of Care: Other (Comment) (Alternative Family Living Facility) Prior Approval Number:    Date Approved/Denied:   PASRR Number:    Discharge Plan:      Current Diagnoses: Patient Active Problem List   Diagnosis Date Noted  . Adjustment disorder with depressed mood 10/16/2015  . TBI (traumatic brain injury) (HCC) 10/16/2015  . Suicidal ideations     Orientation RESPIRATION BLADDER Height & Weight     Self, Place  Normal Continent Weight:   Height:   (185.4 cm)  BEHAVIORAL SYMPTOMS/MOOD NEUROLOGICAL BOWEL NUTRITION STATUS        Diet (Per Nurse, regular diet, picky eater)  AMBULATORY STATUS COMMUNICATION OF NEEDS Skin      (Per Nurse, can tell a person some things, being patient to understand his needs) Normal (Per Nurse, patient has old bruise on shoulder)                       Personal Care Assistance Level of Assistance   (Per Nurse, can wash face and private area, needs assistance with bathing, can feed himself when sitting upright)           Functional Limitations Info  Sight, Hearing, Speech Sight Info: Adequate Hearing Info: Adequate Speech Info: Impaired (Per Nurse, speech is garbled due to TBI, however, can express his needs to understanding)    SPECIAL CARE FACTORS FREQUENCY                       Contractures      Additional Factors Info  Code Status, Allergies Code Status Info: Full Code Allergies Info: No Known Allergies            Current Medications (11/10/2015):  This is the current hospital active medication list Current Facility-Administered Medications  Medication Dose Route Frequency Provider Last Rate Last Dose  . baclofen (LIORESAL) tablet 20 mg  20 mg Oral BID Raeford Razor, MD   20 mg at 11/10/15 1015  . citalopram (CELEXA) tablet 10 mg  10 mg Oral Daily Charm Rings, NP   10 mg at 11/10/15 1015  . levETIRAcetam (KEPPRA) tablet 500 mg  500 mg Oral BID Raeford Razor, MD   500 mg at 11/10/15 1015  . linaclotide (LINZESS) capsule 145 mcg  145 mcg Oral QAC breakfast Raeford Razor, MD   145 mcg at 11/10/15 1015  . LORazepam (ATIVAN) tablet 1 mg  1 mg Oral Q6H PRN Courteney Lyn Mackuen, MD   1 mg at 11/07/15 1751  . nicotine (NICODERM CQ - dosed in mg/24 hours) patch 21 mg  21 mg Transdermal Daily Rolland Porter, MD   21 mg at 11/10/15 1105  . traZODone (DESYREL) tablet 50 mg  50 mg Oral QHS Charm Rings, NP   50 mg at 11/09/15 2200   Current Outpatient Prescriptions  Medication Sig Dispense Refill  . baclofen (LIORESAL) 10 MG tablet Take 2 tablets (20 mg total) by  mouth 2 (two) times daily. 60 each 0  . cephALEXin (KEFLEX) 500 MG capsule Take 1 capsule (500 mg total) by mouth 4 (four) times daily. (Patient not taking: Reported on 11/04/2015) 40 capsule 0  . citalopram (CELEXA) 10 MG tablet Take 1 tablet (10 mg total) by mouth daily. 30 tablet 0  . levETIRAcetam (KEPPRA) 500 MG tablet Take 1 tablet (500 mg total) by mouth 2 (two) times daily. 60 tablet 0  . linaclotide (LINZESS) 145 MCG CAPS capsule Take 1 capsule (145 mcg total) by mouth daily before breakfast. 30 capsule 0  . LORazepam (ATIVAN) 1 MG tablet Take 1 tablet (1 mg total) by mouth every 6 (six) hours as needed for anxiety. 30 tablet 0  . traZODone (DESYREL) 50 MG tablet Take 1 tablet (50 mg total) by mouth at bedtime. 30 tablet 0  . trimethoprim (TRIMPEX) 100 MG tablet Take 1 tablet (100 mg total) by mouth daily. 30 tablet 0     Discharge  Medications: Please see discharge summary for a list of discharge medications.  Relevant Imaging Results:  Relevant Lab Results:   Additional Information  ([address] Maryanna ShapeGuardian, [name] Juchatz, [place] DSS [phone])  Claudean SeveranceLaVonia M Newsome, LCSW    Thedore MinsMojeed Miaa Latterell, MD 11/11/2015 8:55 AM

## 2015-11-10 NOTE — Progress Notes (Signed)
ED CM spoke with Luis Morales to provide pt DME w/c for di/c

## 2015-11-10 NOTE — Progress Notes (Signed)
CSW spoke with Georgina Peererrick McDowell, [facility] who states he will pick up patient tomorrow after 2:00pm. CSW informed him that the wheelchair has arrived for patient. 2nd Shift CSW updated.  Elenore PaddyLaVonia Johannes Everage, LCSWA [phone] ED CSW 11/10/2015 3:27 PM

## 2015-11-10 NOTE — ED Provider Notes (Signed)
UA culture shows multiple specimens.  No definite UTI.  Will have patient provide another sample  Luis DibblesJon Sharniece Gibbon, MD 11/10/15 1851

## 2015-11-11 DIAGNOSIS — F4321 Adjustment disorder with depressed mood: Secondary | ICD-10-CM | POA: Diagnosis not present

## 2015-11-11 LAB — URINE CULTURE: Culture: NO GROWTH

## 2015-11-11 MED ORDER — DOCUSATE SODIUM 100 MG PO CAPS
100.0000 mg | ORAL_CAPSULE | Freq: Once | ORAL | Status: DC
Start: 2015-11-11 — End: 2015-11-11

## 2015-11-11 MED ORDER — DOCUSATE SODIUM 100 MG PO CAPS
200.0000 mg | ORAL_CAPSULE | Freq: Once | ORAL | Status: AC
  Administered 2015-11-11: 200 mg via ORAL

## 2015-11-11 MED ORDER — DOCUSATE SODIUM 50 MG/5ML PO LIQD
50.0000 mg | Freq: Once | ORAL | Status: AC
  Administered 2015-11-11: 50 mg via OTIC
  Filled 2015-11-11: qty 10

## 2015-11-11 NOTE — ED Notes (Signed)
Pt resting with eyes closed; resp even and unlabored; no acute distress noted

## 2015-11-11 NOTE — ED Notes (Signed)
Caregiver from group home presents to transport patient. Patient transferred to personal w/c. Three patient belongings bags removed from locker #32. Social Work at bedside with home prescriptions. Patient in NAD, verbalizes understanding of d/c instructions.

## 2015-11-11 NOTE — Progress Notes (Addendum)
Pt c/o right ear pain. Phoned EDP. (7:50am )EDP evaluated the pt. Phoned pharmacy for colace liquid for pts. Right ear. Placed colace in pts right ear. Pt received a bed bathe. DSS workers came to talk to the pt. (10am ) Pt is hopeful he will leave today and go live with a different provider. Pt was visited by the chaplain today. (12noon)- Pt stated his right ear feels much better now. Irrigation of right ear successful. Obtain copious amounts of wax. Pt gave the okay sign that the ear felt better.Pt had a BM. Report to oncoming shift. (3:20pm)

## 2015-11-11 NOTE — ED Notes (Signed)
Pt resting with eyes closed; resp even and unlabored; no acute distress noted 

## 2015-11-11 NOTE — ED Notes (Signed)
Irrigation of ear done by previous shift.

## 2015-11-11 NOTE — ED Provider Notes (Signed)
I was called to bedside after pt complained to nurse about right ear pain.  The right ear canal is full of hard wax.  I asked the nurse to put colace in ear to soften the wax, then irrigate.  Jacalyn LefevreJulie Malaquias Lenker, MD 11/11/15 1539

## 2015-11-11 NOTE — Progress Notes (Signed)
CSW received a telephone call from Georgina Peererrick McDowell with [facility]. He stated [name] will pick up patient today. He stated he will need an FL2, discharge summary, and prescriptions for patient.   [name], LCSWA [phone] ED CSW 11/11/2015 4:37 PM

## 2015-11-11 NOTE — Progress Notes (Addendum)
CSW updated Consulting civil engineerCharge RN regarding patient's disposition. Georgina Peererrick McDowell, Owner of [facility] will be picking up patient today after 2:00pm.    Elenore PaddyLaVonia Cathyrn Deas, LCSWA [phone] ED CSW 11/11/2015 8:32 AM

## 2016-03-05 ENCOUNTER — Ambulatory Visit (HOSPITAL_COMMUNITY)
Admission: EM | Admit: 2016-03-05 | Discharge: 2016-03-05 | Disposition: A | Discharge status: Home / Self Care | Payer: Medicare Other | Attending: Family Medicine | Admitting: Family Medicine

## 2016-03-05 ENCOUNTER — Encounter (HOSPITAL_COMMUNITY): Payer: Self-pay | Admitting: Emergency Medicine

## 2016-03-05 ENCOUNTER — Ambulatory Visit (INDEPENDENT_AMBULATORY_CARE_PROVIDER_SITE_OTHER): Payer: Medicare Other

## 2016-03-05 DIAGNOSIS — S9031XA Contusion of right foot, initial encounter: Secondary | ICD-10-CM

## 2016-03-05 DIAGNOSIS — S93601A Unspecified sprain of right foot, initial encounter: Secondary | ICD-10-CM

## 2016-03-05 NOTE — Discharge Instructions (Signed)
Xray of your foot was negative for fracture or dislocation. You do have swelling from contusion. We have applied pressure dressing today. May take it off in 2 days as tolerated. Use Tylenol as needed for pain. See us soon if symptoms worsens.

## 2016-03-05 NOTE — ED Provider Notes (Signed)
[facility]    CSN: [id number] Arrival date & time: 03/05/16  1624     History   Chief Complaint Chief Complaint  Patient presents with  . Foot Pain    HPI Luis Morales is a 40 y.o. male.   The history is provided by the patient and a caregiver. No language interpreter was used.   He was trying to move the dresser in his room earlier today and the TV fell on his right foot. Pain is about 5/10 in severity, no pain meds used yet.  Past Medical History:  Diagnosis Date  . Traumatic brain injury (HCC)   . UTI (urinary tract infection)     Patient Active Problem List   Diagnosis Date Noted  . Adjustment disorder with depressed mood 10/16/2015  . TBI (traumatic brain injury) (HCC) 10/16/2015  . Suicidal ideations     History reviewed. No pertinent surgical history.     Home Medications    Prior to Admission medications   Medication Sig Start Date End Date Taking? Authorizing Provider  baclofen (LIORESAL) 10 MG tablet Take 2 tablets (20 mg total) by mouth 2 (two) times daily. 11/08/15  Yes Charm RingsJamison Y Lord, NP  citalopram (CELEXA) 10 MG tablet Take 1 tablet (10 mg total) by mouth daily. 11/08/15  Yes Charm RingsJamison Y Lord, NP  levETIRAcetam (KEPPRA) 500 MG tablet Take 1 tablet (500 mg total) by mouth 2 (two) times daily. 11/08/15  Yes Charm RingsJamison Y Lord, NP  linaclotide [facility](LINZESS) 145 MCG CAPS capsule Take 1 capsule (145 mcg total) by mouth daily before breakfast. 11/08/15  Yes Charm RingsJamison Y Lord, NP  LORazepam (ATIVAN) 1 MG tablet Take 1 tablet (1 mg total) by mouth every 6 (six) hours as needed for anxiety. 11/08/15  Yes Charm RingsJamison Y Lord, NP  traZODone (DESYREL) 50 MG tablet Take 1 tablet (50 mg total) by mouth at bedtime. 11/08/15  Yes Charm RingsJamison Y Lord, NP  trimethoprim (TRIMPEX) 100 MG tablet Take 1 tablet (100 mg total) by mouth daily. 11/08/15  Yes Charm RingsJamison Y Lord, NP    Family History Family History  Problem Relation Age of Onset  . Family history unknown: Yes    Social  History Social History  Substance Use Topics  . Smoking status: Current Every Day Smoker  . Smokeless tobacco: Current User  . Alcohol use No     Allergies   Patient has no known allergies.   Review of Systems Review of Systems  Respiratory: Negative.   Cardiovascular: Negative.   Gastrointestinal: Negative.   Musculoskeletal: Positive for joint swelling.       Right foot pain  All other systems reviewed and are negative.    Physical Exam Triage Vital Signs ED Triage Vitals [03/05/16 1710]  Enc Vitals Group     BP 128/84     Pulse Rate 92     Resp      Temp 98.4 F (36.9 C)     Temp Source Oral     SpO2 97 %     Weight      Height      Head Circumference      Peak Flow      Pain Score 5     Pain Loc      Pain Edu?      Excl. in GC?    No data found.   Updated Vital Signs BP 128/84 (BP Location: Left Arm)   Pulse 92   Temp 98.4 F (36.9 C) (  Oral)   SpO2 97%   Visual Acuity Right Eye Distance:   Left Eye Distance:   Bilateral Distance:    Right Eye Near:   Left Eye Near:    Bilateral Near:     Physical Exam  Constitutional: He appears well-developed. No distress.  Cardiovascular: Normal rate, regular rhythm and normal heart sounds.   No murmur heard. Pulmonary/Chest: Effort normal and breath sounds normal. No respiratory distress. He has no wheezes.  Musculoskeletal:       Right ankle: He exhibits decreased range of motion and swelling. He exhibits no laceration and normal pulse. Tenderness.       Feet:  Nursing note and vitals reviewed.    UC Treatments / Results  Labs (all labs ordered are listed, but only abnormal results are displayed) Labs Reviewed - No data to display  EKG  EKG Interpretation None       Radiology No results found. Dg Foot Complete Right  Result Date: 03/05/2016 CLINICAL DATA:  Per group home care giver: patient was trying to move a TV this morning and the TV dropped onto the right foot. Patient does not  speak, in a wheel chair. No known prior trauma it any to the right foot. Do not know if patient is a.*comment was truncated* EXAM: RIGHT FOOT COMPLETE - 3+ VIEW COMPARISON:  None. FINDINGS: Soft tissue swelling of the dorsum of the foot. No evidence of acute fracture dislocation. Nonstandard views. IMPRESSION: No evidence of acute fracture or dislocation. Soft tissue swelling of the dorsum of the foot. Nonstandard views does limit evaluation of the midfoot Electronically Signed   By: Genevive BiStewart  Edmunds M.D.   On: 03/05/2016 18:06    Procedures Procedures (including critical care time)  Medications Ordered in UC Medications - No data to display   Initial Impression / Assessment and Plan / UC Course  I have reviewed the triage vital signs and the nursing notes.  Pertinent labs & imaging results that were available during my care of the patient were reviewed by me and considered in my medical decision making (see chart for details).  Clinical Course as of Mar 05 1818  Wynelle LinkSun Mar 05, 2016  1818 Xray of his right foot neg for fracture or dislocation. Does have some contusion which should resolve in few day. Soft compression dressing applied with Ace wrape. Light weight bearing for the next few days. Tylenol as needed for pain See us soon if worsening.  [KE]    Clinical Course User Index [KE] Doreene ElandKehinde T Marcquis Ridlon, MD     Final Clinical Impressions(s) / UC Diagnoses   Final diagnoses:  None    New Prescriptions New Prescriptions   No medications on file  Sprain of right foot, initial encounter  Contusion of right foot, initial encounter     Doreene ElandKehinde T Harjot Zavadil, MD 03/05/16 1820

## 2016-03-05 NOTE — ED Notes (Signed)
Right foot toes warm, pink, with prompt cap refill following placement of Ace.

## 2016-03-05 NOTE — ED Triage Notes (Signed)
Patient presents with Caregiver he states that patient was trying to move TV in room, and the TV fell on the patients foot.

## 2016-07-17 ENCOUNTER — Encounter (HOSPITAL_COMMUNITY): Payer: Self-pay | Admitting: Emergency Medicine

## 2016-07-17 ENCOUNTER — Ambulatory Visit (HOSPITAL_COMMUNITY)
Admission: EM | Admit: 2016-07-17 | Discharge: 2016-07-17 | Disposition: A | Discharge status: Home / Self Care | Payer: Medicare Other | Attending: Internal Medicine | Admitting: Internal Medicine

## 2016-07-17 DIAGNOSIS — T2026XA Burn of second degree of forehead and cheek, initial encounter: Secondary | ICD-10-CM

## 2016-07-17 MED ORDER — SILVER SULFADIAZINE 1 % EX CREA: TOPICAL_CREAM | CUTANEOUS | 0 refills | Status: AC
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
## 2016-08-02 ENCOUNTER — Ambulatory Visit: Payer: Medicare Other | Attending: Internal Medicine | Admitting: Physical Therapy

## 2016-08-02 DIAGNOSIS — M6281 Muscle weakness (generalized): Secondary | ICD-10-CM | POA: Insufficient documentation

## 2016-08-02 DIAGNOSIS — R2689 Other abnormalities of gait and mobility: Secondary | ICD-10-CM | POA: Diagnosis present

## 2016-08-03 NOTE — Therapy (Signed)
[facility] [address] [place], [place], [address] Phone: [phone]   Fax:  [phone]  Physical Therapy Evaluation  Patient Details  Name: Luis Morales MRN: [id number] Date of Birth: 03-03-1976 Referring Provider: Ralene Ok, MD  Encounter Date: 08/02/2016      PT End of Session - 08/13/16 2200    Visit Number 1   Number of Visits 1   Authorization Type Medicare   PT Start Time 1450   PT Stop Time 1605   PT Time Calculation (min) 75 min      Past Medical History:  Diagnosis Date  . Traumatic brain injury (HCC)   . UTI (urinary tract infection)     No past surgical history on file.  There were no vitals filed for this visit.       Subjective Assessment - 2016/08/13 09-01-2154    Subjective pt presents for wheelchair evaluation - pt states he wants to be able to push w/chair, but referral to [facility] states "electric wheelchair eval"   Patient is accompained by: --  caregiver   Currently in Pain? 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:  power wheelchair to be trialed with pt to determine if he is able to safely operate; if not manual wheelchair will be recommended   PT Frequency One time visit   PT Duration --  eval only   PT Treatment/Interventions --  wheelchair management   Consulted and Agree with Plan of Care Patient;Family member/caregiver      Patient will benefit from skilled therapeutic intervention in order to  improve the following deficits and impairments:  Decreased mobility, Impaired tone, Decreased strength, Decreased safety awareness  Visit Diagnosis: Muscle weakness (generalized) - Plan: PT plan of care cert/re-cert  Other abnormalities of gait and mobility - Plan: PT plan of care cert/re-cert      G-Codes - August 13, 2016 09/01/06    Functional Assessment Tool Used (Outpatient Only) --   Functional Limitation Mobility   Mobility: Walking and Moving Around Current Status (O9629) CM   Mobility: Walking and Moving Around Goal Status (B2841) CM   Mobility: Walking and Moving Around Discharge Status (L2440) CM       Problem List Patient Active Problem List   Diagnosis Date Noted  . Adjustment disorder with depressed mood 10/16/2015  . TBI (traumatic brain injury) (HCC) 10/16/2015  . Suicidal ideations     Kary Kos, PT 2016/08/13, 10:14 PM  [facility] [address] [place], [place], [address] Phone: [phone]   Fax:  [phone]  Name: Luis Morales MRN: [id number] Date of Birth: 1976-04-07

## 2016-12-20 ENCOUNTER — Ambulatory Visit: Payer: Medicare Other | Admitting: Podiatry

## 2017-01-05 ENCOUNTER — Ambulatory Visit: Payer: Medicare Other | Admitting: Podiatry

## 2018-04-20 IMAGING — DX DG FOOT COMPLETE 3+V*R*
3 series · 3 of 3 positions shown · non-contrast
Comparison: None.

CLINICAL DATA: Per [REDACTED] care giver: patient was trying to
move a TV this morning and the TV dropped onto the right foot.
any to the right foot. Do not know if patient is a...*comment was
truncated*

EXAM:
RIGHT FOOT COMPLETE - 3+ VIEW

[foot ap]
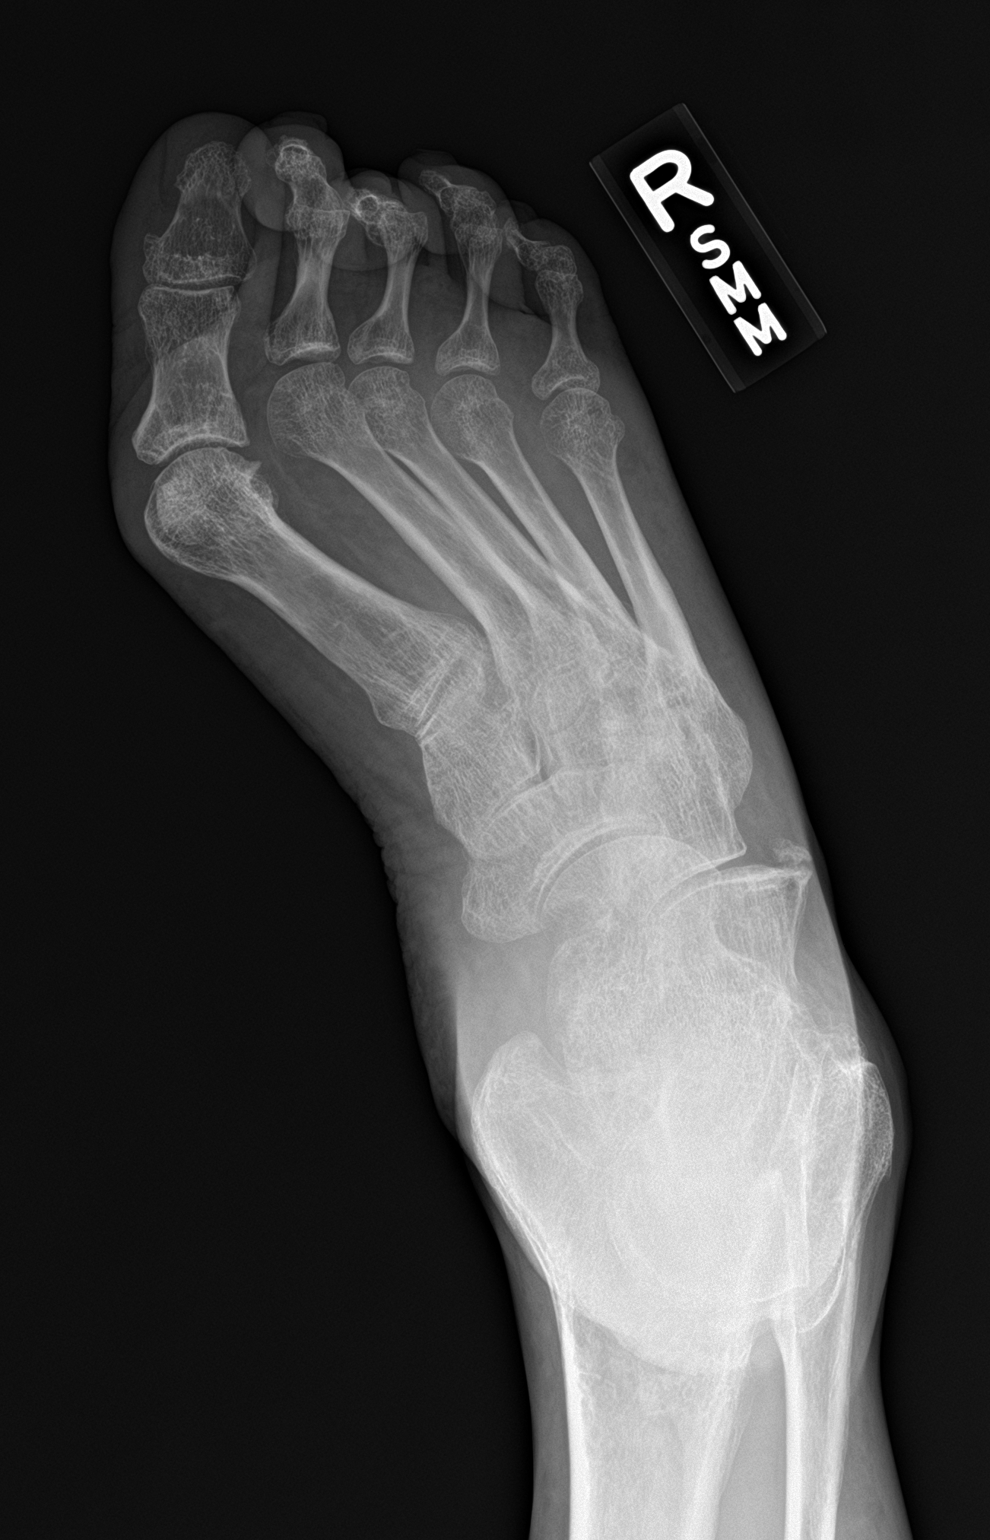

[foot obl]
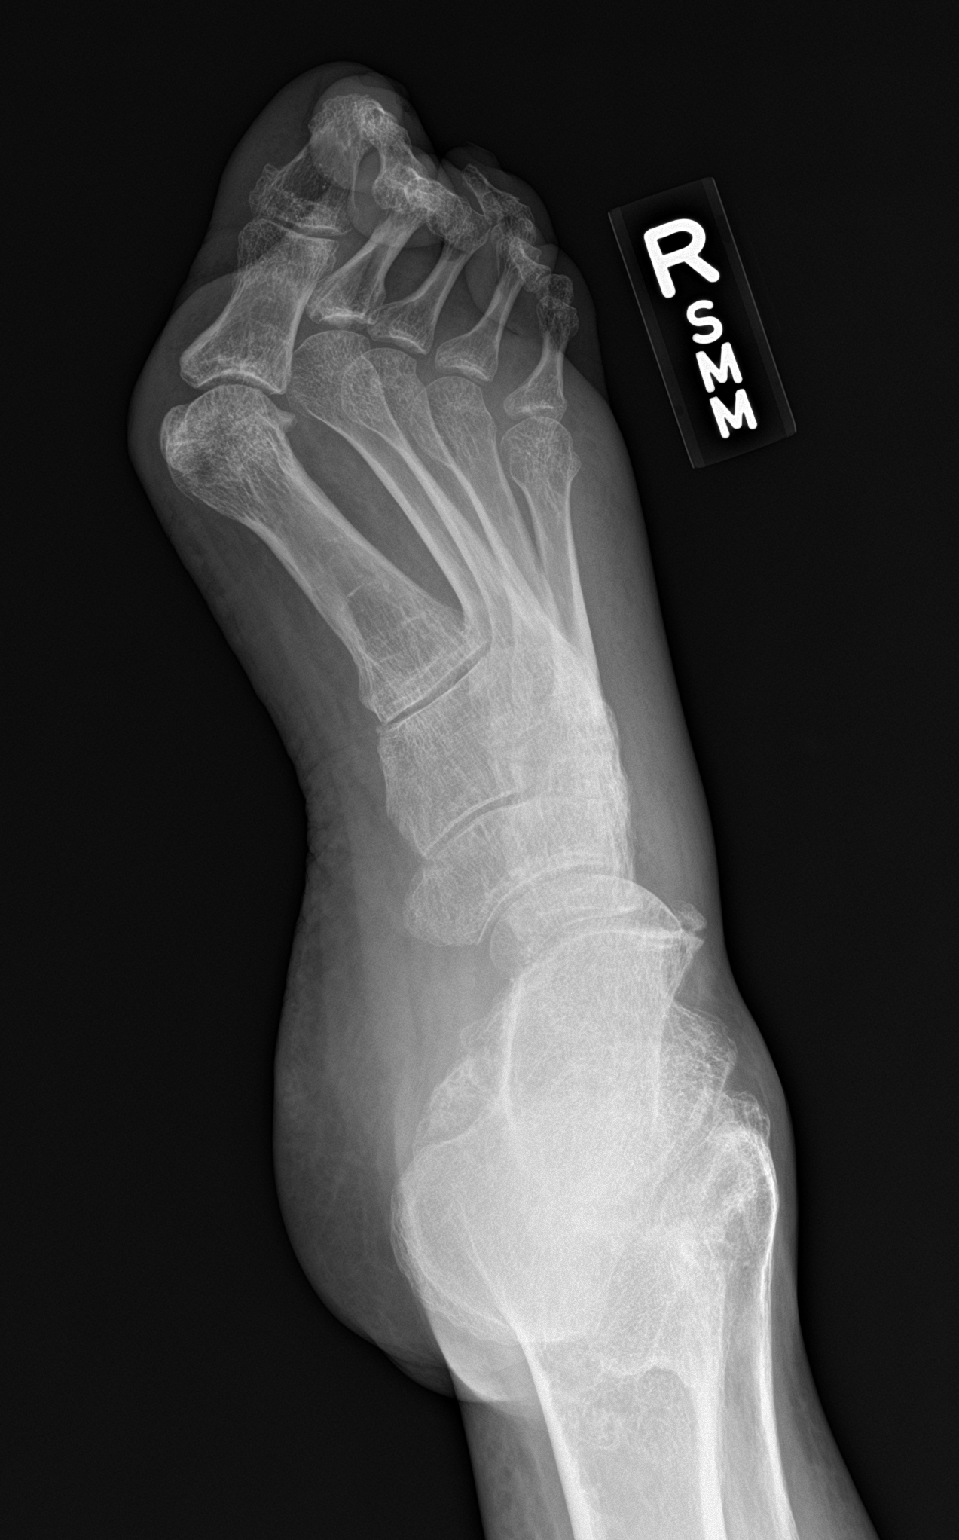

[foot lat]
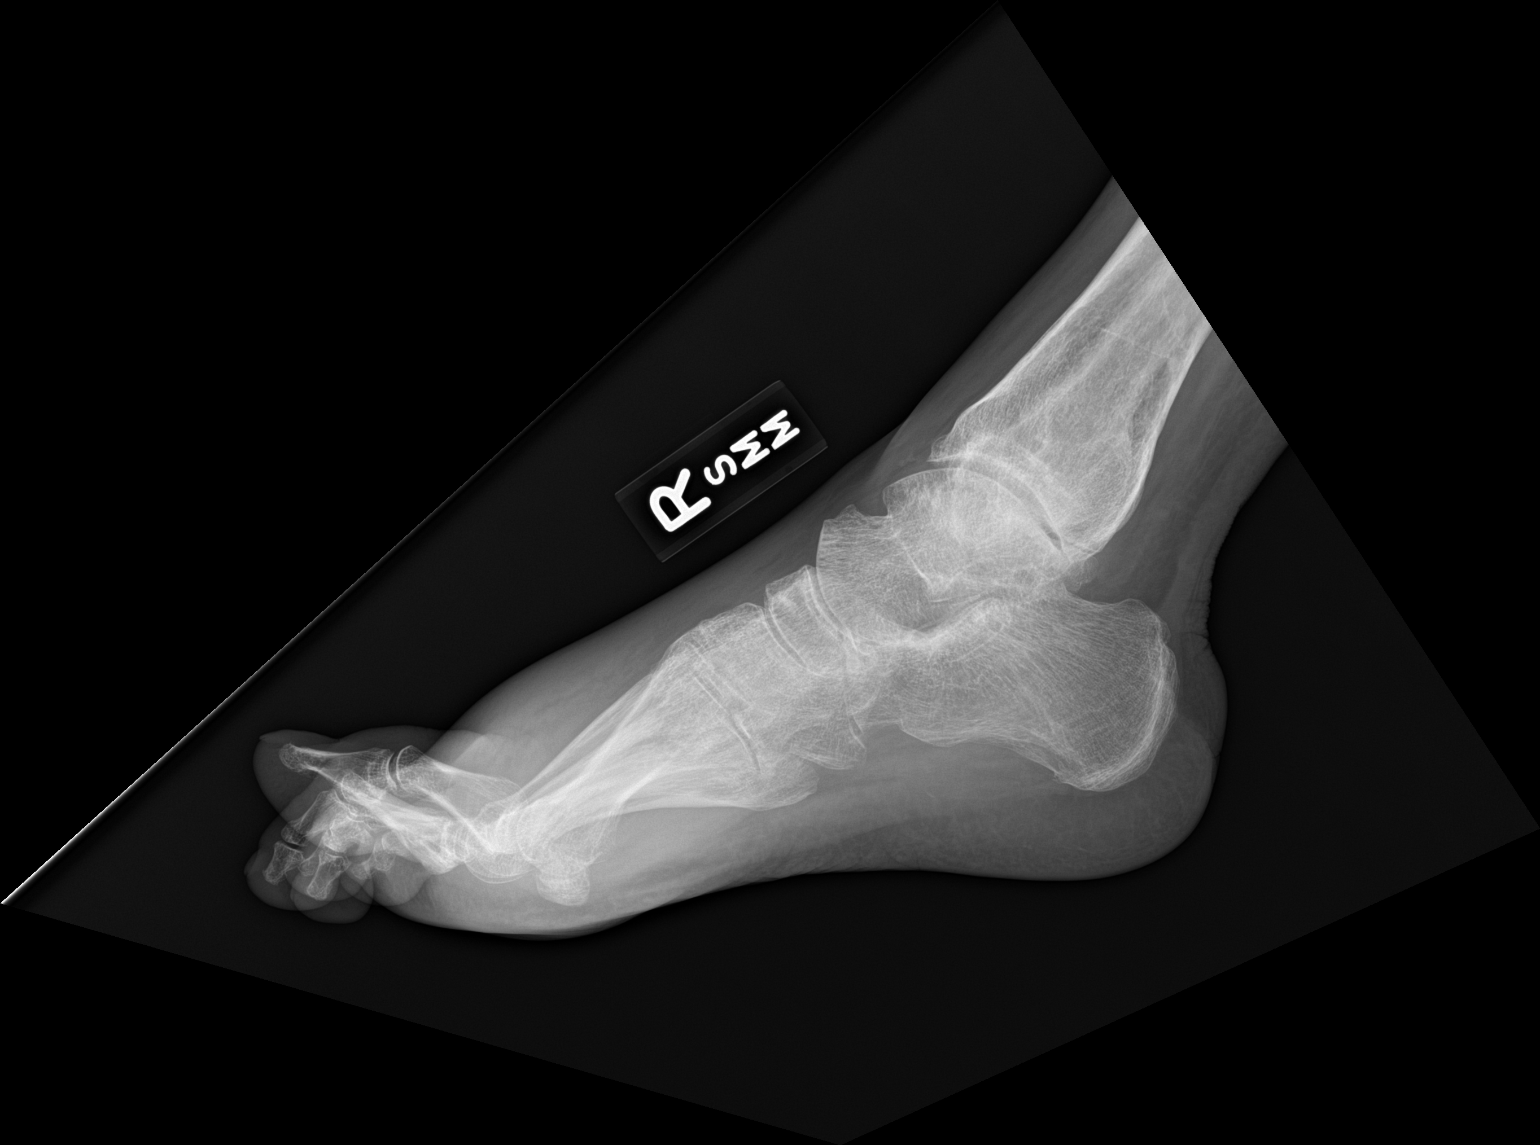

[3 of 3 positions shown; findings below may reference images not displayed]

FINDINGS: Soft tissue swelling of the dorsum of the foot. No evidence of acute
fracture dislocation. Nonstandard views.
IMPRESSION: No evidence of acute fracture or dislocation.

Soft tissue swelling of the dorsum of the foot.

Nonstandard views does limit evaluation of the midfoot
